# Patient Record
Sex: Male | Born: 1960 | Race: White | Hispanic: No | Marital: Married | Smoking: Current some day smoker
Health system: Western US, Community
[De-identification: ages and names within clinical notes are randomized; demographics above are authoritative.]

---

## 2014-09-20 NOTE — Telephone Encounter (Signed)
Per PB ok to schedule Pt to see him. Please transfer to MA .     LMOVM asking for a call back.

## 2014-09-20 NOTE — Telephone Encounter (Signed)
Wesley Booker is returning your call. Attempted to reach MA directly, but received no answer.    Patient call back number is: [phone]    Can a detailed message be left? yes    Additional Comments:

## 2014-09-23 NOTE — Telephone Encounter (Signed)
Spoke with pt he is scheduled for 9/27 with PB

## 2014-10-08 ENCOUNTER — Ambulatory Visit: Admit: 2014-10-08 | Discharge: 2014-10-09 | Payer: BC Managed Care – PPO | Attending: MD

## 2014-10-08 DIAGNOSIS — J301 Allergic rhinitis due to pollen: Secondary | ICD-10-CM

## 2014-10-08 NOTE — Addendum Note (Signed)
Addended by: Johann Capers on: 10/08/2014 11:10 AM     Modules accepted: Medications

## 2014-10-08 NOTE — Progress Notes (Signed)
Wesley Booker is a 54 y.o. male being seen today for Establish Care       HISTORY OF PRESENT ILLNESS  HPI  Patient is here to establish care.   Does have severe allergic rhinitis symptoms  Does have arthritis affecting knees and ankles.  Does have chronic back pain and takes aleve for pain.    Strong history of skin cancer in  Family. Does see dermatologist dr Dewayne Hatch.  There is also rather strong history of heart disease in his family. Patient himself has not had any anginal symptoms. He does admit to smoking one cigar per week.   We also discussed his weight. He does meet the criteria for obesity based on his BMI.   Past Medical History   Diagnosis Date   . Allergic rhinitis    . Arthritis      bilateral knee arthritis and ankle arthritis   . Dyslipidemia      on crestor     Past Surgical History   Procedure Laterality Date   . Menisectomy Bilateral    . Vasectomy       Family History   Problem Relation Age of Onset   . Osteoporosis Mother    . Hyperlipidemia Mother    . Hypertension Mother    . Heart disease Father    . Skin cancer Father    . Heart disease Brother    . Skin cancer Maternal Aunt    . Heart disease Maternal Grandfather    . Heart disease Paternal Grandfather      Social History     Social History Main Topics   . Smoking status: Current Some Day Smoker   . Smokeless tobacco: Never Used      Comment: 1 Cigar a week   . Alcohol Use: No   . Drug Use: No   . Sexual Activity:     Partners: Female     Social History   . Marital Status: Married     Spouse Name: N/A   . Number of Children: N/A   . Years of Education: N/A     Other Topics Concern   . None       Outpatient Prescriptions Marked as Taking for the 10/08/14 encounter (Office Visit) with Cyndia Diver, MD   Medication Sig Dispense Refill   . ascorbic acid, vitamin C, (VITAMIN C) 250 MG tablet Take 250 mg by mouth daily.     . cyanocobalamin (VITAMIN B-12) 500 MCG tablet Take 500 mcg by mouth daily.     Marland Kitchen docusate sodium (COLACE) 100 MG capsule  Take 100 mg by mouth 2 (two) times daily.     . rosuvastatin (CRESTOR) 10 MG tablet Take 10 mg by mouth daily.       Allergies no known allergies    No results found for any previous visit.    Health Maintenance  Health Maintenance Due   Topic Date Due   . COLORECTAL CANCER SCREENING  07/03/2009   . Lung Cancer Screening Program  07/04/2014   . INFLUENZA VACCINE  09/12/2014       REVIEW OF SYSTEMS  Review of Systems   Constitutional: Negative for fever, chills and fatigue.   HENT: Positive for congestion (as recurrent congestion symptoms recently did get a cortisone injection from his prior PCP currently having much less congestion feeling better overall.). Negative for drooling, postnasal drip, rhinorrhea and sinus pressure.    Eyes: Negative for visual disturbance.   Respiratory: Negative  for cough.    Cardiovascular: Negative for chest pain and palpitations.   Gastrointestinal: Negative for abdominal pain, blood in stool and anal bleeding.   Endocrine: Negative for polydipsia and polyuria.   Neurological: Negative for dizziness, light-headedness and headaches.     12 point review of systems except as noted above    PHYSICAL EXAM  BP 108/70 mmHg  Pulse 60  Temp(Src) 36.5 ?C (97.7 ?F) (Oral)  Resp 16  Ht 5' 10 (1.778 m)  Wt 237 lb (107.502 kg)  BMI 34.01 kg/m2  SpO2 98%  Normalized BMI data available only for age 32 to 20 years.  No exam data present    Physical Exam   Constitutional: He is oriented to person, place, and time. He appears well-developed and well-nourished.   HENT:   Head: Normocephalic and atraumatic.   Right Ear: External ear normal.   Left Ear: External ear normal.   Nose: Nose normal.   Mouth/Throat: Oropharynx is clear and moist.   Eyes: Conjunctivae are normal. Pupils are equal, round, and reactive to light.   Neck: Normal range of motion.   Cardiovascular: Normal rate, regular rhythm and normal heart sounds.  Exam reveals no friction rub.    No murmur heard.  Pulmonary/Chest: Effort  normal and breath sounds normal. He has no wheezes. He has no rales.   Abdominal: Soft. He exhibits no distension and no mass. There is no tenderness. There is no rebound and no guarding.   Neurological: He is alert and oriented to person, place, and time.   Skin: Skin is warm and dry.   Psychiatric: He has a normal mood and affect. His behavior is normal. Judgment and thought content normal.   Vitals reviewed.      ASSESSMENT & PLAN  Problem List Items Addressed This Visit        Respiratory    Allergic rhinitis due to pollen - Primary  Currently he is feeling better as he recently received a cortisone injection from his prior PCP. I did recommend antihistamines and possible intranasal cortisone spray in the future.        Other    BMI 34.0-34.9,adult    Current smoker on some days      Other Visit Diagnoses     Non morbid obesity due to excess calories      Recommended weight loss via a diet and exercise program. I will see him back for a full physical.          No Follow-up on file.

## 2014-10-09 MED ORDER — ascorbic acid, vitamin C, (VITAMIN C) 1000 MG tablet
1000 | Freq: Every day | ORAL | 0.00 refills | 90.00000 days | Status: AC
Start: 2014-10-09 — End: ?

## 2014-10-09 MED ORDER — naproxen sodium (ALEVE) 220 MG tablet
220 | Freq: Every day | ORAL | Status: AC
Start: 2014-10-09 — End: ?

## 2014-10-09 MED ORDER — ascorbic acid, vitamin C, (VITAMIN C) 250 MG tablet
250 | ORAL_TABLET | Freq: Every day | ORAL | 0.00 refills | 90.00000 days | Status: DC
Start: 2014-10-09 — End: 2014-10-09

## 2014-10-09 NOTE — Telephone Encounter (Signed)
From: Jeanene Erb  To: Cyndia Diver, MD  Sent: 10/09/2014 6:06 AM PDT  Subject: Non-Urgent Medical Question    Update, I received my Flu shot at my work 10/08/14.  Thank you.

## 2014-10-09 NOTE — Telephone Encounter (Signed)
From: Jeanene Erb  To: Cyndia Diver, MD  Sent: 10/09/2014 6:04 AM PDT  Subject: Medication Question    How do I get my correct medications listed on this page? I do not take Vitamin B-12, my Vitamin C is 1000 mg (I wasn't sure as the appt) and need to add Naproxen Sodium 220mg  1 daily.    Thank you.

## 2014-10-09 NOTE — Telephone Encounter (Signed)
Health Maint. Updated.   Message to pt. To inform.

## 2014-10-21 NOTE — Telephone Encounter (Signed)
LMOVM asking for a call back to reschedule appt 12/27. PB is out of office

## 2014-10-24 NOTE — Telephone Encounter (Signed)
Pt has been rescheduled 01/14/15

## 2014-12-19 NOTE — Telephone Encounter (Signed)
Patient is calling and states he would like to know what his cholesterol level is. Please call back at [phone]

## 2014-12-19 NOTE — Telephone Encounter (Signed)
Pt informed we have not received his records from Dr Elwyn Reach office. Pt will be calling today.

## 2015-01-07 ENCOUNTER — Encounter: Payer: BC Managed Care – PPO | Attending: MD

## 2015-01-14 ENCOUNTER — Encounter: Payer: BC Managed Care – PPO | Attending: MD

## 2015-01-22 ENCOUNTER — Ambulatory Visit: Admit: 2015-01-22 | Discharge: 2015-01-22 | Payer: BC Managed Care – PPO | Attending: MD

## 2015-01-22 DIAGNOSIS — Z0000 Encounter for general adult medical examination without abnormal findings: Secondary | ICD-10-CM

## 2015-01-22 MED ORDER — diclofenac sodium (VOLTAREN) 1 % Gel topical gel
1 | Freq: Four times a day (QID) | TOPICAL | 1 refills | 23.00000 days | Status: DC
Start: 2015-01-22 — End: 2015-11-28

## 2015-01-22 NOTE — Progress Notes (Signed)
Wesley Booker is a 55 y.o. male being seen today for Annual Exam       HISTORY OF PRESENT ILLNESS  HPI   Pleasant gentleman comes in today for a full physical examination. Overall he has been in very good health. He does have dyslipidemia and he does take Crestor for this without myalgias. Denies any chest pain denies any shortness of breath symptoms. Also complains of left total pain for the last few weeks. 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 No other trauma. He points to his left lateral elbow.  Otherwise she's been good health. Denies any chest pain and shortness of breath abdominal pain no melena or hematochezia. His last colonoscopy was at age 8 and was normal.      Past Medical History   Diagnosis Date   . Allergic rhinitis    . Arthritis      bilateral knee arthritis and ankle arthritis   . Dyslipidemia      on crestor     Past Surgical History   Procedure Laterality Date   . Menisectomy Bilateral    . Vasectomy     . Meniscectomy Bilateral      Family History   Problem Relation Age of Onset   . Osteoporosis Mother    . Hyperlipidemia Mother    . Hypertension Mother    . Heart disease Father    . Skin cancer Father    . Heart disease Brother    . Skin cancer Maternal Aunt    . Heart disease Maternal Grandfather    . Heart disease Paternal Grandfather      Social History     Social History Main Topics   . Smoking status: Current Some Day Smoker   . Smokeless tobacco: Never Used      Comment: 1 Cigar a week   . Alcohol use No   . Drug use: No   . Sexual activity: Yes     Partners: Female     Social History   . Marital status: Married     Spouse name: N/A   . Number of children: N/A   . Years of education: N/A     Other Topics Concern   . None       Outpatient Prescriptions Marked as Taking for the 01/22/15 encounter (Office Visit) with Cyndia Diver, MD   Medication Sig Dispense Refill   . ascorbic acid, vitamin C, (VITAMIN C) 1000 MG tablet Take 1 tablet (1,000 mg total) by mouth daily.     Marland Kitchen  docusate sodium (COLACE) 100 MG capsule Take 100 mg by mouth 2 (two) times daily.     . naproxen sodium (ALEVE) 220 MG tablet Take 1 tablet (220 mg total) by mouth daily.     . rosuvastatin (CRESTOR) 10 MG tablet Take 10 mg by mouth daily.       No Known Allergies    No results found for any previous visit.    Health Maintenance  Health Maintenance Due   Topic Date Due   . COLORECTAL CANCER SCREENING  07/03/2009       REVIEW OF SYSTEMS  Review of Systems   Constitutional: Negative.  Negative for activity change, appetite change, chills, diaphoresis, fatigue, fever and unexpected weight change.   HENT: Negative.  Negative for congestion, dental problem, ear pain, hearing loss, mouth sores, nosebleeds, postnasal drip, sinus pressure, sneezing, sore throat, tinnitus and voice change.    Eyes: Negative.  Negative  for photophobia, pain, discharge and visual disturbance.   Respiratory: Negative.  Negative for cough, chest tightness, shortness of breath and wheezing.    Cardiovascular: Negative.  Negative for chest pain, palpitations and leg swelling.   Gastrointestinal: Negative.  Negative for abdominal distention, abdominal pain, anal bleeding, blood in stool, constipation, diarrhea, nausea, rectal pain and vomiting.   Endocrine: Negative.  Negative for cold intolerance, heat intolerance, polydipsia and polyuria.   Genitourinary: Negative.  Negative for decreased urine volume, dysuria, flank pain, frequency, genital sores, hematuria, penile pain, penile swelling, scrotal swelling, testicular pain and urgency.   Musculoskeletal: Negative for arthralgias, back pain, joint swelling, myalgias and neck stiffness.        Left elbow pain   Skin: Negative.  Negative for rash.   Allergic/Immunologic: Negative.  Negative for environmental allergies.   Neurological: Negative.  Negative for dizziness, tremors, seizures, syncope, weakness, numbness and headaches.   Hematological: Negative.  Negative for adenopathy. Does not  bruise/bleed easily.   Psychiatric/Behavioral: Negative.  Negative for behavioral problems, dysphoric mood, sleep disturbance and suicidal ideas. The patient is not nervous/anxious.      12 point review of systems except as noted above    PHYSICAL EXAM  Visit Vitals   . BP 110/70 (BP Location: Left arm, Patient Position: Sitting)   . Pulse 64   . Temp 36.7 ?C (98 ?F) (Oral)   . Resp 16   . Ht 5' 10 (1.778 m)   . Wt 236 lb (107 kg)   . SpO2 98%   . BMI 33.86 kg/m2     Facility age limit for growth percentiles is 20 years.  No exam data present    Physical Exam   Constitutional: He is oriented to person, place, and time. He appears well-developed and well-nourished. No distress.   Obese gentleman in no acute distress   HENT:   Head: Normocephalic and atraumatic.   Right Ear: External ear normal.   Left Ear: External ear normal.   Nose: Nose normal.   Mouth/Throat: Oropharynx is clear and moist. No oropharyngeal exudate.   Eyes: Conjunctivae, EOM and lids are normal. Pupils are equal, round, and reactive to light. Right eye exhibits no exudate. Left eye exhibits no exudate.   Neck: Trachea normal, normal range of motion and full passive range of motion without pain. Neck supple. No JVD present. No muscular tenderness present. Carotid bruit is not present. No edema, no erythema and normal range of motion present. No thyroid mass and no thyromegaly present.   Cardiovascular: Normal rate, regular rhythm, S1 normal, S2 normal, normal heart sounds and normal pulses.  Exam reveals no gallop and no friction rub.    No murmur heard.  Pulmonary/Chest: Effort normal and breath sounds normal. No accessory muscle usage. No respiratory distress. He has no wheezes. He has no rales.   Abdominal: Soft. Bowel sounds are normal. He exhibits no distension and no mass. There is no hepatosplenomegaly. There is no tenderness. There is no rebound, no guarding and no CVA tenderness. Hernia confirmed negative in the right inguinal area and  confirmed negative in the left inguinal area.   Genitourinary: Rectum normal, prostate normal, testes normal and penis normal. Rectal exam shows no external hemorrhoid, no mass, no tenderness and guaiac negative stool. Prostate is not enlarged and not tender. Right testis shows no mass, no swelling and no tenderness. Left testis shows no mass, no swelling and no tenderness. Circumcised. No penile tenderness. No discharge found.  Musculoskeletal: Normal range of motion. He exhibits tenderness (patient does have tenderness of his left lateral epicondyle upon palpation.). He exhibits no edema.        Right shoulder: He exhibits normal range of motion, no tenderness, no bony tenderness, no swelling, no deformity, no pain and normal strength.   Lymphadenopathy:     He has no cervical adenopathy.        Right: No inguinal and no supraclavicular adenopathy present.        Left: No inguinal and no supraclavicular adenopathy present.   Neurological: He is alert and oriented to person, place, and time. He has normal strength and normal reflexes. No cranial nerve deficit or sensory deficit. He displays a negative Romberg sign.   Skin: Skin is warm and dry. No ecchymosis and no rash noted. No erythema.   Psychiatric: He has a normal mood and affect. His speech is normal and behavior is normal. Judgment and thought content normal. Cognition and memory are normal.   Vitals reviewed.      ASSESSMENT & PLAN  Problem List Items Addressed This Visit        Other    Current smoker on some days      Other Visit Diagnoses     Physical exam    -  Primary    Relevant Orders    CBC with Auto Differential -Routine    Comprehensive Metabolic Panel -Routine    Coronary Risk Lipid Panel -Routine    PSA Screening -Routine  Physical examination was conducted from today laboratory studies requested     BMI 33.0-33.9,adult        Lateral epicondylitis of left elbow        Relevant Medications    diclofenac sodium (VOLTAREN) 1 % Gel topical gel     Other Relevant Orders    Home Medical Equipment  Recommended topical diclofenac gel also recommended tennis elbow strap     Dyslipidemia      Recommend Crestor check lipid panel and hepatic function panel     Obesity: Recommended weight loss via diet and exercise. I referred him to weight watchers. See me for recheck in 4 months.         No Follow-up on file.

## 2015-01-24 ENCOUNTER — Other Ambulatory Visit: Admit: 2015-01-24 | Discharge: 2015-01-24 | Payer: BC Managed Care – PPO

## 2015-01-24 DIAGNOSIS — Z0000 Encounter for general adult medical examination without abnormal findings: Secondary | ICD-10-CM

## 2015-01-24 LAB — COMPREHENSIVE METABOLIC PANEL
ALT - Alanine Aminotransferase: 11 IU/L (ref 7–52)
AST - Aspartate Aminotransferase: 18 IU/L (ref 10–50)
Albumin/Globulin Ratio: 1.4 (ref >0.9)
Albumin: 3.9 g/dL (ref 3.5–5.0)
Alkaline Phosphatase: 68 IU/L (ref 34–104)
Anion Gap: 8 mmol/L (ref 3.0–11.0)
BUN: 25 mg/dL — ABNORMAL HIGH (ref 6–23)
Bilirubin Total: 0.9 mg/dL (ref 0.3–1.2)
CO2 - Carbon Dioxide: 24 mmol/L (ref 21.0–31.0)
Calcium: 8.9 mg/dL (ref 8.6–10.3)
Chloride: 108 mmol/L (ref 98–111)
Creatinine: 0.98 mg/dL (ref 0.65–1.30)
GFR Estimate: >60 mL/min/{1.73_m2} (ref >=60)
Globulin: 2.7 g/dL (ref 2.2–3.7)
Glucose: 81 mg/dL (ref 80–99)
Potassium: 4.1 mmol/L (ref 3.5–5.1)
Protein Total: 6.6 g/dL (ref 6.0–8.0)
Sodium: 140 mmol/L (ref 135–143)

## 2015-01-24 LAB — CBC WITH AUTO DIFFERENTIAL
Basophils %: 1 % (ref 0–2)
Basophils, Absolute: 0.1 10*3/ÂµL (ref 0.0–0.2)
Eosinophils %: 3 % (ref 0–7)
Eosinophils, Absolute: 0.2 10*3/ÂµL (ref 0.0–0.7)
HCT: 42.1 % (ref 42.0–54.0)
Hemoglobin: 14.3 g/dL (ref 12.0–18.0)
Lymphocytes %: 37 % (ref 25–45)
Lymphocytes, Absolute: 2.2 10*3/ÂµL (ref 1.1–4.3)
MCH: 31.4 pg (ref 27.0–34.0)
MCHC: 34 g/dL (ref 32.0–36.0)
MCV: 92.3 fL (ref 81.0–99.0)
MPV: 9.6 fL (ref 7.4–10.4)
Monocytes %: 8 % (ref 0–12)
Monocytes, Absolute: 0.5 10*3/ÂµL (ref 0.0–1.2)
Neutrophils %: 52 % (ref 35–70)
Neutrophils, Absolute: 3.1 10*3/ÂµL (ref 1.6–7.3)
Platelet Count: 183 10*3/ÂµL (ref 150–400)
RBC: 4.56 10*6/ÂµL — ABNORMAL LOW (ref 4.70–6.10)
RDW: 13.2 % (ref 11.5–14.5)
WBC: 5.9 10*3/ÂµL (ref 4.8–10.8)

## 2015-01-24 LAB — CORONARY RISK LIPID PANEL
Cholesterol, HDL: 44 mg/dL (ref >=40)
Cholesterol: 173 mg/dL (ref <200)
LDL Calculated: 113 mg/dL — ABNORMAL HIGH (ref <100)
Triglyceride: 78 mg/dL (ref 30–149)

## 2015-01-24 LAB — PSA, TOTAL SCREENING: Prostate Specific Antigen, Total: 0.66 ng/mL (ref 0.00–4.00)

## 2015-01-31 NOTE — Telephone Encounter (Signed)
Spoke with patient and advised him of lab results 

## 2015-01-31 NOTE — Telephone Encounter (Signed)
Patient called in regards to wanting to know if his results are in , Patient states he had his labs done last Friday , Please call [phone]

## 2015-03-25 NOTE — Telephone Encounter (Signed)
Patient is calling and would like to set up an appointment for an allergy shot as well as a Hep A shot.  Please call patient to schedule at [phone]. Okay to leave message.

## 2015-03-25 NOTE — Telephone Encounter (Signed)
Moved to another, existing phone note

## 2015-03-25 NOTE — Telephone Encounter (Signed)
Medication Being Requested:  Requested medication is not on patient's med list and they are requesting albuterol for nebulizer.    Send To Pharmacy, or Office Pick Up? Send to [id number] - [place], [place] - [address] [facility]  [address] [place] [address]  Phone: [phone] Fax: [phone]    How many Days Remaining of medication: 0    Additional Information:     Patient has not had this refilled by Dr Royal Hawthorn yet.  Any questions please call patient back at [phone], okay to leave message.      **Informed patient that it may take up to 48-72 hours to fill prescription.**    Future Appointments:  Future Appointments  Date Time Provider Department Center   05/30/2015 8:20 AM Cyndia Diver, MD [facility]

## 2015-03-25 NOTE — Telephone Encounter (Addendum)
Call to pt. - BUSY x 1, Albuterol has not been rx'd by PB.   For what is he using it for?     Copy and pasted from another phone note, that came in this AM:  Patient is calling and would like to set up an appointment for an allergy shot as well as a Hep A shot. Please call patient to schedule at [phone]. Okay to leave message.

## 2015-03-27 NOTE — Telephone Encounter (Signed)
Wesley Booker is returning your call. Attempted to reach clinic directly, but received no answer.    Patient call back number is: [phone] (home) [phone] (work)      Best time for call back.any    Can a detailed message be left? yes    Additional Comments:

## 2015-03-28 MED ORDER — albuterol (PROVENTIL) 2.5 mg /3 mL (0.083 %) nebulizer solution
2.5 | RESPIRATORY_TRACT | 3 refills | 17.00000 days | Status: AC | PRN
Start: 2015-03-28 — End: ?

## 2015-03-28 NOTE — Telephone Encounter (Signed)
Pt. Stating he prefers to receive the kenalog and also wants the HepA vaccine, (will needing to make another appt. In 6 month for the 2nd vaccine, to be given in 6 months.   Requesting the Albuterol to be via Nebulizer vials.   Wants RX sent to [facility] in [place].

## 2015-03-28 NOTE — Telephone Encounter (Signed)
Next appt. 05/30/15  Pt. Going on a Cruise in April and was told by Dr.Bujos should have an Hep A vaccination   - Would like to know if able to have an Hep A injeciton?   - Also would like to know if able to get a Allergy shot  (kenalog)   - Also asking to have an RX for Albuterol vials for Nebulizer, using this for his Allergies, was told by PB to just call and ask for an RX, that PB would approved.    (pt. Was told this by PB at [facility] meeting)     PB - please advise, OK to give RX for Albuterol?  OK for Hep A vaccine? OK for Allergy shot? Can the Allergy shot be given at the same time as the Hep A? thanks

## 2015-03-28 NOTE — Telephone Encounter (Signed)
First, he can differently have hepatitis A shot and is okay with me for albuterol inhaler 1-2 puffs every 4 hours when necessary wheezing. I am not a big fan of him getting cortisone injection as although this does help allergies can also increase his risk of infection. If he absolutely wants a cortisone injection , I would be okay this one time but I do not want this on a yearly  basis. (Kenalog 40 mg IM ?1)

## 2015-03-31 ENCOUNTER — Institutional Professional Consult (permissible substitution): Admit: 2015-03-31 | Discharge: 2015-03-31 | Payer: BC Managed Care – PPO | Attending: MD

## 2015-03-31 DIAGNOSIS — Z23 Encounter for immunization: Secondary | ICD-10-CM

## 2015-03-31 MED ORDER — triamcinolone acetonide (KENALOG-40) injection 40 mg
40 | Freq: Once | INTRAMUSCULAR | Status: AC
Start: 2015-03-31 — End: 2015-03-31
  Administered 2015-03-31: 23:00:00 40 mg via INTRAMUSCULAR

## 2015-03-31 NOTE — Progress Notes (Signed)
..    Administrations This Visit     triamcinolone acetonide (KENALOG-40) injection 40 mg     Admin Date Action Dose Route Site Administered By          03/31/2015  15:54 Given 40 mg Intramuscular Right Deltoid Wyatt Haste, MA       Ordering Provider:  Cyndia Diver, MD     NDC:  [phone]     Lot#:  [id number]     Manufacturer:  [employer]     Patient Supplied?:  No     Comments:  exp- 06/2016                     ..  Immunizations as of 03/31/2015     Name Date Dose VIS Date Route    Hepatitis A 03/31/2015  3:54 PM 0.5 mL 07/31/2014 Intramuscular    Site: Left deltoid    Given By: Wyatt Haste, MA    Documented By: Wyatt Haste, MA    Manufacturer: [employer]    Lot: [id number]    NDC: [phone]    Expiration Date: 11/27/2016    Influenza TIV (IM) 10/08/2014 -- -- --    Documented By: Johann Capers, CMA    Comment: received at work, [facility], pt. reported

## 2015-03-31 NOTE — Telephone Encounter (Signed)
Pt would like to see allergist in [place]. i called pt to inform that [facility] does not have an allergist and if he would like to be seen in [place].

## 2015-04-02 NOTE — Telephone Encounter (Signed)
Spoke to pt and informed there is an allergist in [place] [facility].. Pt informs he would like to be seen by him. Referral entered.

## 2015-05-30 ENCOUNTER — Ambulatory Visit: Admit: 2015-05-30 | Discharge: 2015-05-30 | Payer: BC Managed Care – PPO | Attending: MD

## 2015-05-30 DIAGNOSIS — F4321 Adjustment disorder with depressed mood: Secondary | ICD-10-CM

## 2015-05-30 NOTE — Progress Notes (Signed)
Wesley Booker is a 55 y.o. male being seen today for Hyperlipidemia and Weight Check (in Jan. 2017 was 236lb)       HISTORY OF PRESENT ILLNESS  HPI    This very pleasant gentleman comes in today to discuss multiple issues.  We first discussed his father's death last week. Patient is grieving at this time. We discussed the circumstances of his father's illness. We discussed treatment of the patient's grieving. Patient denies feeling suicidal or homicidal.    Discussed his dyslipidemia. Mildly elevated LDL. He continued to take Crestor a daily basis without any myalgias. We discussed weight loss via diet and exercise. Discussed low saturated fat diet today. In addition we discussed his obesity. He has been trying to lose weight via diet and exercise. He happily has lost about 17 pounds since last visit.            Past Medical History:   Diagnosis Date   . Allergic rhinitis    . Arthritis     bilateral knee arthritis and ankle arthritis   . Dyslipidemia     on crestor     Past Surgical History:   Procedure Laterality Date   . MENISCECTOMY Bilateral    . menisectomy Bilateral    . VASECTOMY       Family History   Problem Relation Age of Onset   . Osteoporosis Mother    . Hyperlipidemia Mother    . Hypertension Mother    . Heart disease Father    . Skin cancer Father    . Heart disease Brother    . Skin cancer Maternal Aunt    . Heart disease Maternal Grandfather    . Heart disease Paternal Grandfather      Social History     Social History Main Topics   . Smoking status: Current Some Day Smoker   . Smokeless tobacco: Never Used      Comment: 1 Cigar a week   . Alcohol use No   . Drug use: No   . Sexual activity: Yes     Partners: Female     Social History   . Marital status: Married     Spouse name: N/A   . Number of children: N/A   . Years of education: N/A     Other Topics Concern   . None       Outpatient Prescriptions Marked as Taking for the 05/30/15 encounter (Office Visit) with Cyndia Diver, MD   Medication Sig  Dispense Refill   . albuterol (PROVENTIL) 2.5 mg /3 mL (0.083 %) nebulizer solution Take 3 mLs (2.5 mg total) by nebulization every 4 (four) hours as needed for Wheezing or Shortness of Breath (as needed). 120 vial 3   . ascorbic acid, vitamin C, (VITAMIN C) 1000 MG tablet Take 1 tablet (1,000 mg total) by mouth daily.     . diclofenac sodium (VOLTAREN) 1 % Gel topical gel Apply 2 g topically 4 (four) times daily. 100 g 1   . docusate sodium (COLACE) 100 MG capsule Take 100 mg by mouth 2 (two) times daily.     . naproxen sodium (ALEVE) 220 MG tablet Take 1 tablet (220 mg total) by mouth daily.     . pseudoephedrine (SUDAFED) 30 MG tablet Take 30 mg by mouth every 4 (four) hours as needed for Congestion.     . rosuvastatin (CRESTOR) 10 MG tablet Take 10 mg by mouth daily.  No Known Allergies    No visits with results within 1 Month(s) from this visit.  Latest known visit with results is:    Lab Walk-In on 01/24/2015   Component Date Value Ref Range Status   . WBC 01/24/2015 5.9  4.8 - 10.8 10*3/?L Final   . RBC 01/24/2015 4.56* 4.70 - 6.10 10*6/?L Final   . Hemoglobin 01/24/2015 14.3  12.0 - 18.0 g/dL Final   . HCT 11/91/4782 42.1  42.0 - 54.0 % Final   . MCV 01/24/2015 92.3  81.0 - 99.0 fL Final   . MCH 01/24/2015 31.4  27.0 - 34.0 pg Final   . MCHC 01/24/2015 34.0  32.0 - 36.0 g/dL Final   . RDW 95/62/1308 13.2  11.5 - 14.5 % Final   . Platelet Count 01/24/2015 183  150 - 400 10*3/?L Final   . MPV 01/24/2015 9.6  7.4 - 10.4 fL Final   . Neutrophils % 01/24/2015 52  35 - 70 % Final   . Lymphocytes % 01/24/2015 37  25 - 45 % Final   . Monocytes % 01/24/2015 8  0 - 12 % Final   . Eosinophils % 01/24/2015 3  0 - 7 % Final   . Basophils % 01/24/2015 1  0 - 2 % Final   . Neutrophils, Absolute 01/24/2015 3.1  1.6 - 7.3 10*3/?L Final   . Lymphocytes, Absolute 01/24/2015 2.2  1.1 - 4.3 10*3/?L Final   . Monocytes, Absolute 01/24/2015 0.5  0.0 - 1.2 10*3/?L Final   . Eosinophils, Absolute 01/24/2015 0.2  0.0 - 0.7  10*3/?L Final   . Basophils, Absolute 01/24/2015 0.1  0.0 - 0.2 10*3/?L Final   . Differential Type 01/24/2015 Automated Differential   Final   . Sodium 01/24/2015 140  135 - 143 mmol/L Final   . Potassium 01/24/2015 4.1  3.5 - 5.1 mmol/L Final   . Chloride 01/24/2015 108  98 - 111 mmol/L Final   . CO2 - Carbon Dioxide 01/24/2015 24.0  21.0 - 31.0 mmol/L Final   . Glucose 01/24/2015 81  80 - 99 mg/dL Final   . BUN 65/78/4696 25* 6 - 23 mg/dL Final   . Creatinine 29/52/8413 0.98  0.65 - 1.30 mg/dL Final   . Calcium 24/40/1027 8.9  8.6 - 10.3 mg/dL Final   . AST - Aspartate Aminotransferase 01/24/2015 18  10 - 50 IU/L Final   . ALT - Alanine Amino transferase 01/24/2015 11  7 - 52 IU/L Final   . Alkaline Phosphatase 01/24/2015 68  34 - 104 IU/L Final   . Bilirubin Total 01/24/2015 0.9  0.3 - 1.2 mg/dL Final   . Protein Total 01/24/2015 6.6  6.0 - 8.0 g/dL Final   . Albumin 25/36/6440 3.9  3.5 - 5.0 g/dL Final   . Globulin 34/74/2595 2.7  2.2 - 3.7 g/dL Final   . Albumin/Globulin Ratio 01/24/2015 1.4  >0.9 Final   . Anion Gap 01/24/2015 8.0  3.0 - 11.0 mmol/L Final   . GFR Estimate 01/24/2015 >60  >=60 mL/min/1.24m*2 Final   . GFR Additional Info 01/24/2015    Final   . Triglyceride 01/24/2015 78  30 - 149 mg/dL Final   . Cholesterol 63/87/5643 173  <200 mg/dL Final   . Cholesterol, HDL 01/24/2015 44  >=40 mg/dL Final   . LDL Calculated 01/24/2015 329* <100 mg/dL Final   . PSA 51/88/4166 0.66  0.00 - 4.00 ng/mL Final       Health Maintenance  Health Maintenance Due   Topic Date Due   . COLORECTAL CANCER SCREENING  07/03/2009       REVIEW OF SYSTEMS  Review of Systems   Constitutional: Negative for fatigue.   Respiratory: Negative for cough and shortness of breath.    Cardiovascular: Negative for chest pain and palpitations.   Gastrointestinal: Negative for abdominal pain.   Endocrine: Negative for polydipsia and polyuria.   Musculoskeletal: Negative for myalgias.   Psychiatric/Behavioral: Negative for agitation,  dysphoric mood, self-injury, sleep disturbance and suicidal ideas. The patient is not nervous/anxious.         She is grieving the loss of his father.     12 point review of systems except as noted above    PHYSICAL EXAM  BP 112/70 (BP Location: Left arm, Patient Position: Sitting)  Pulse 64  Temp 36.7 ?C (98 ?F) (Oral)   Resp 16  Wt 219 lb (99.3 kg)  SpO2 100%  BMI 31.42 kg/m2  Facility age limit for growth percentiles is 20 years.  No exam data present    Physical Exam   Constitutional: He is oriented to person, place, and time. He appears well-developed and well-nourished.   HENT:   Head: Normocephalic and atraumatic.   Eyes: Pupils are equal, round, and reactive to light.   Cardiovascular: Normal rate, regular rhythm and normal heart sounds.  Exam reveals no gallop.    No murmur heard.  Pulmonary/Chest: Effort normal and breath sounds normal. He has no wheezes. He has no rales.   Neurological: He is alert and oriented to person, place, and time.   Psychiatric:   Patient is grieving the loss of his father but denies feeling suicidal or homicidal. He is well-groomed and well-dressed and establishes good eye contact.       ASSESSMENT & PLAN  Problem List Items Addressed This Visit        Other    Current smoker on some days    BMI 31.0-31.9,adult    Grieving - Primary  I did offer him counseling. At this time declines. He does have good family support at home. Offered him medication at this time he declines.     Dyslipidemia  Recommended low saturated fat diet recommended regular exercise continue Crestor     Obesity    Relevant Medications     continue weight loss efforts via diet and exercise see me for recheck in the next 6 months.         No Follow-up on file.    Marland Kitchen

## 2015-05-30 NOTE — Telephone Encounter (Signed)
Approved.  

## 2015-05-30 NOTE — Telephone Encounter (Signed)
Pt. Has CPE appt. On 01/27/16, last annual labs done 01/24/15.  Pt. Requesting to have annual labs done prior to his CPE appt.     PB - OK to order the same labs as in Jan. 2017? thanks

## 2015-05-30 NOTE — Telephone Encounter (Signed)
Noted, Labs ordered for 01/26/16, for prior to his CPE.   Message sent to pt via my chart to inform of fasting labs ordered.

## 2015-09-02 NOTE — Telephone Encounter (Signed)
Noted, next Lipid due 01/26/16, per lab orders  RX filled

## 2015-09-02 NOTE — Telephone Encounter (Signed)
Medication Being Requested:  rosuvastatin (CRESTOR) 10 MG tablet    Send To Pharmacy, or Office Pick Up? [facility]     [facility] - [place], [place] - [address]  [address]  [address]  [place] [address]  Phone: [phone] Fax: [phone]        How many Days Remaining of medication: 2weeks   Additional Information:       **Informed patient that it may take up to 48-72 hours to fill prescription.**    Future Appointments:  Future Appointments  Date Time Provider Department Center   10/01/2015 11:40 AM Cyndia Diver, MD [facility]   01/27/2016 9:20 AM Cyndia Diver, MD [id number]

## 2015-09-03 MED ORDER — rosuvastatin (CRESTOR) 10 MG tablet
10 | ORAL_TABLET | Freq: Every day | ORAL | 1 refills | 90.00000 days | Status: DC
Start: 2015-09-03 — End: 2016-03-08

## 2015-10-01 ENCOUNTER — Encounter: Payer: BC Managed Care – PPO | Attending: MD

## 2015-10-01 ENCOUNTER — Institutional Professional Consult (permissible substitution): Admit: 2015-10-01 | Discharge: 2015-10-01 | Payer: BC Managed Care – PPO | Attending: MD

## 2015-10-01 DIAGNOSIS — Z23 Encounter for immunization: Secondary | ICD-10-CM

## 2015-10-01 NOTE — Progress Notes (Signed)
Immunizations     Name Date Dose VIS Date Route    Hepatitis A 10/01/2015 0.5 mL 07/31/2014 Intramuscular    Site: Left deltoid    Given By: Johann Capers, CMA    Documented By: Johann Capers, CMA    Manufacturer: [employer]    Lot: [id number]    NDC: [id number]    Expiration Date: 02/12/2018

## 2015-11-28 ENCOUNTER — Ambulatory Visit: Admit: 2015-11-28 | Discharge: 2015-11-28 | Payer: BC Managed Care – PPO | Attending: MD

## 2015-11-28 DIAGNOSIS — J45998 Other asthma: Secondary | ICD-10-CM

## 2015-11-28 MED ORDER — predniSONE (DELTASONE) 20 MG tablet
20 | ORAL_TABLET | Freq: Every day | ORAL | 0 refills | 11.50000 days | Status: AC
Start: 2015-11-28 — End: 2015-12-03

## 2015-11-28 MED ORDER — albuterol (PROVENTIL HFA;VENTOLIN HFA;PROAIR HFA) 90 mcg/actuation inhaler
90 | RESPIRATORY_TRACT | 0 refills | 17.00000 days | Status: AC | PRN
Start: 2015-11-28 — End: ?

## 2015-11-28 MED ORDER — ipratropium-albuterol (DUO-NEB) 0.5 mg-3 mg(2.5 mg base)/3 mL nebulizer solution 3 mL
0.5 | Freq: Once | RESPIRATORY_TRACT | Status: AC
Start: 2015-11-28 — End: 2015-11-28
  Administered 2015-11-28: 18:00:00 0.5 mL via RESPIRATORY_TRACT

## 2015-11-28 NOTE — Discharge Instructions (Signed)
Patient Education   Patient Education     Asthma, Adult  Asthma is a recurring condition in which the airways tighten and narrow. Asthma can make it difficult to breathe. It can cause coughing, wheezing, and shortness of breath. Asthma episodes, also called asthma attacks, range from minor to life-threatening. Asthma cannot be cured, but medicines and lifestyle changes can help control it.  CAUSES  Asthma is believed to be caused by inherited (genetic) and environmental factors, but its exact cause is unknown. Asthma may be triggered by allergens, lung infections, or irritants in the air. Asthma triggers are different for each person. Common triggers include:   ? Animal dander.  ? Dust mites.  ? Cockroaches.  ? Pollen from trees or grass.  ? Mold.  ? Smoke.  ? Air pollutants such as dust, household cleaners, hair sprays, aerosol sprays, paint fumes, strong chemicals, or strong odors.  ? Cold air, weather changes, and winds (which increase molds and pollens in the air).  ? Strong emotional expressions such as crying or laughing hard.  ? Stress.  ? Certain medicines (such as aspirin) or types of drugs (such as beta-blockers).  ? Sulfites in foods and drinks. Foods and drinks that may contain sulfites include dried fruit, potato chips, and sparkling grape juice.  ? Infections or inflammatory conditions such as the flu, a cold, or an inflammation of the nasal membranes (rhinitis).  ? Gastroesophageal reflux disease (GERD).  ? Exercise or strenuous activity.  SYMPTOMS  Symptoms may occur immediately after asthma is triggered or many hours later. Symptoms include:  ? Wheezing.  ? Excessive nighttime or early morning coughing.  ? Frequent or severe coughing with a common cold.  ? Chest tightness.  ? Shortness of breath.  DIAGNOSIS   The diagnosis of asthma is made by a review of your medical history and a physical exam. Tests may also be performed. These may include:  ? Lung function studies. These tests show how much air  you breathe in and out.  ? Allergy tests.  ? Imaging tests such as X-rays.  TREATMENT   Asthma cannot be cured, but it can usually be controlled. Treatment involves identifying and avoiding your asthma triggers. It also involves medicines. There are 2 classes of medicine used for asthma treatment:   ? Controller medicines. These prevent asthma symptoms from occurring. They are usually taken every day.  ? Reliever or rescue medicines. These quickly relieve asthma symptoms. They are used as needed and provide short-term relief.  Your health care provider will help you create an asthma action plan. An asthma action plan is a written plan for managing and treating your asthma attacks. It includes a list of your asthma triggers and how they may be avoided. It also includes information on when medicines should be taken and when their dosage should be changed. An action plan may also involve the use of a device called a peak flow meter. A peak flow meter measures how well the lungs are working. It helps you monitor your condition.  HOME CARE INSTRUCTIONS   ? Take medicines only as directed by your health care provider. Speak with your health care provider if you have questions about how or when to take the medicines.  ? Use a peak flow meter as directed by your health care provider. Record and keep track of readings.  ? Understand and use the action plan to help minimize or stop an asthma attack without needing to seek medical care.  ?   Control your home environment in the following ways to help prevent asthma attacks:    Do not smoke. Avoid being exposed to secondhand smoke.    Change your heating and air conditioning filter regularly.    Limit your use of fireplaces and wood stoves.    Get rid of pests (such as roaches and mice) and their droppings.    Throw away plants if you see mold on them.    Clean your floors and dust regularly. Use unscented cleaning products.    Try to have someone else vacuum for you regularly.  Stay out of rooms while they are being vacuumed and for a short while afterward. If you vacuum, use a dust mask from a hardware store, a double-layered or microfilter vacuum cleaner bag, or a vacuum cleaner with a HEPA filter.    Replace carpet with wood, tile, or vinyl flooring. Carpet can trap dander and dust.    Use allergy-proof pillows, mattress covers, and box spring covers.    Wash bed sheets and blankets every week in hot water and dry them in a dryer.    Use blankets that are made of polyester or cotton.    Clean bathrooms and kitchens with bleach. If possible, have someone repaint the walls in these rooms with mold-resistant paint. Keep out of the rooms that are being cleaned and painted.    Wash hands frequently.  SEEK MEDICAL CARE IF:   ? You have wheezing, shortness of breath, or a cough even if taking medicine to prevent attacks.  ? The colored mucus you cough up (sputum) is thicker than usual.  ? Your sputum changes from clear or white to yellow, green, gray, or bloody.  ? You have any problems that may be related to the medicines you are taking (such as a rash, itching, swelling, or trouble breathing).  ? You are using a reliever medicine more than 2-3 times per week.  ? Your peak flow is still at 50-79% of your personal best after following your action plan for 1 hour.  ? You have a fever.  SEEK IMMEDIATE MEDICAL CARE IF:   ? You seem to be getting worse and are unresponsive to treatment during an asthma attack.  ? You are short of breath even at rest.  ? You get short of breath when doing very little physical activity.  ? You have difficulty eating, drinking, or talking due to asthma symptoms.  ? You develop chest pain.  ? You develop a fast heartbeat.  ? You have a bluish color to your lips or fingernails.  ? You are light-headed, dizzy, or faint.  ? Your peak flow is less than 50% of your personal best.     This information is not intended to replace advice given to you by your health care  provider. Make sure you discuss any questions you have with your health care provider.     Document Released: 12/28/2004 Document Revised: 09/18/2014 Document Reviewed: 07/27/2012  Elsevier Interactive Patient Education ?2017 [employer]

## 2015-11-28 NOTE — Progress Notes (Signed)
Wesley Booker is a 55 y.o. male, DOB 05-27-1960, who presents today for Wheezing (nebulizer treatment is not working; coughing up phlegm; congestion; x 7 days)  .    HISTORY OF PRESENT ILLNESS:  55 year old male presents complaining of cough for the last 7 days. His cough is productive. He says he has had sore throat at times and nasal congestion. He says his sore throat has resolved and now he still has some postnasal drip. He denies fever, otalgia, nausea and vomiting. He says he does not have asthma but he suffers from severe allergies and for this he uses a nebulizer machine at home. He says at the beginning of this illness he was using his nebulizer machine 4 times a day and this was helping significantly however for the last few days he reports that he has noticed he's been wheezing despite the use of nebulizers and they only help him for a brief period of time. He says the last time he used his nebs was yesterday.     HPI  Past Medical History:   Diagnosis Date   . Allergic rhinitis    . Arthritis     bilateral knee arthritis and ankle arthritis   . Dyslipidemia     on crestor     No Known Allergies  Past Surgical History:   Procedure Laterality Date   . MENISCECTOMY Bilateral    . menisectomy Bilateral    . VASECTOMY       Social History     Social History   . Marital status: Married     Spouse name: N/A   . Number of children: N/A   . Years of education: N/A     Occupational History   . Not on file.     Social History Main Topics   . Smoking status: Current Some Day Smoker   . Smokeless tobacco: Never Used      Comment: 1 Cigar a week   . Alcohol use No   . Drug use: No   . Sexual activity: Yes     Partners: Female     Other Topics Concern   . Not on file     Social History Narrative   . No narrative on file     Current Outpatient Prescriptions on File Prior to Visit   Medication Sig Dispense Refill   . albuterol (PROVENTIL) 2.5 mg /3 mL (0.083 %) nebulizer solution Take 3 mLs (2.5 mg total) by  nebulization every 4 (four) hours as needed for Wheezing or Shortness of Breath (as needed). 120 vial 3   . ascorbic acid, vitamin C, (VITAMIN C) 1000 MG tablet Take 1 tablet (1,000 mg total) by mouth daily.     Marland Kitchen docusate sodium (COLACE) 100 MG capsule Take 100 mg by mouth 2 (two) times daily.     . naproxen sodium (ALEVE) 220 MG tablet Take 1 tablet (220 mg total) by mouth daily.     . rosuvastatin (CRESTOR) 10 MG tablet Take 1 tablet by mouth daily. 90 tablet 1   . pseudoephedrine (SUDAFED) 30 MG tablet Take 30 mg by mouth every 4 (four) hours as needed for Congestion.       No current facility-administered medications on file prior to visit.      Family History   Problem Relation Age of Onset   . Osteoporosis Mother    . Hyperlipidemia Mother    . Hypertension Mother    . Heart disease Father    .  Skin cancer Father    . Heart disease Brother    . Skin cancer Maternal Aunt    . Heart disease Maternal Grandfather    . Heart disease Paternal Grandfather          REVIEW OF SYSTEMS:  Review of Systems   As per HPI    PHYSICAL EXAM:    Vitals:    11/28/15 1006   BP: 112/64   Pulse: 67   Resp: 12   Temp: 36.8 ?C (98.3 ?F)   SpO2: 98%    Body mass index is 31.57 kg/m?Marland Kitchen  Physical Exam   Constitutional: He is oriented to person, place, and time. He appears well-developed and well-nourished. No distress.   Well appearing   HENT:   Head: Normocephalic and atraumatic.   Right Ear: External ear normal.   Left Ear: External ear normal.   Eyes: Conjunctivae and EOM are normal. Right eye exhibits no discharge. Left eye exhibits no discharge. No scleral icterus.   Cardiovascular: Normal rate, regular rhythm and normal heart sounds.    Pulmonary/Chest: Effort normal. No respiratory distress. He has wheezes (Wheezes throughout lung fields, moderate improvement with duoneb administration). He has no rales.   Neurological: He is alert and oriented to person, place, and time.   Skin: He is not diaphoretic.   Psychiatric: He has a  normal mood and affect. His behavior is normal. Judgment and thought content normal.   Vitals reviewed.      LAB RESULTS:    No results found for this or any previous visit (from the past 24 hour(s)).    IMAGING:    No results found.    ASSESSMENT & PLAN:    1. Smoker    2. Wheezing  - ipratropium-albuterol (DUO-NEB) 0.5 mg-3 mg(2.5 mg base)/3 mL nebulizer solution 3 mL; Take 3 mL by nebulization once.  - Nebulizer treatment    3. Post viral asthma  - predniSONE (DELTASONE) 20 MG tablet; Take 2 tablets by mouth daily for 5 days.  Dispense: 10 tablet; Refill: 0  - albuterol (PROVENTIL HFA;VENTOLIN HFA;PROAIR HFA) 90 mcg/actuation inhaler; Inhale 2 puffs into the lungs every 4 hours as needed for Wheezing (Cough).  Dispense: 1 Inhaler; Refill: 0    Persistent cough and wheezing in the setting of recent URI. Wheezing and symptoms moderately improved after DuoNeb administration. Pt with h/o allergic rhinitis and likely a component of underlying RAD. Suspect viral induced asthma. PNA less likely. Pt declines CXR. Will treat with a prednisone burst. Albuterol inhaler also prescribed. Patient is aware he should not use his inhaler with his nebulizer. Pt is instructed to follow up with his primary physician. Pt is to return for any worsening signs or symptoms or any other concerns. Pt verbalized understanding of this plan and agrees with plan.         This note was transcribed using speech recognition software. Please contact us for clarification if any questions arise relating to the wording of this document.

## 2015-11-30 NOTE — Telephone Encounter (Signed)
Hello, my name is Becky and I am calling from [facility].  We are calling to check how you are doing after being seen here? We hope that you are feeling better and thank you for choosing us for your urgent care. You will be receiving a survey in the mail or via email and we would greatly appreciate your feedback.  If you have any questions about your visit with us, please call us back at #[phone].

## 2016-01-20 ENCOUNTER — Encounter: Payer: BC Managed Care – PPO | Attending: MD

## 2016-01-26 ENCOUNTER — Other Ambulatory Visit: Admit: 2016-01-26 | Discharge: 2016-01-26 | Payer: BC Managed Care – PPO

## 2016-01-26 DIAGNOSIS — Z125 Encounter for screening for malignant neoplasm of prostate: Secondary | ICD-10-CM

## 2016-01-26 LAB — COMPREHENSIVE METABOLIC PANEL
ALT - Alanine Aminotransferase: 11 IU/L (ref 7–52)
AST - Aspartate Aminotransferase: 17 IU/L (ref 10–50)
Albumin/Globulin Ratio: 1.5 (ref >0.9)
Albumin: 3.7 g/dL (ref 3.5–5.0)
Alkaline Phosphatase: 59 IU/L (ref 34–104)
Anion Gap: 8 mmol/L (ref 3.0–11.0)
BUN: 24 mg/dL — ABNORMAL HIGH (ref 6–23)
Bilirubin Total: 0.5 mg/dL (ref 0.3–1.2)
CO2 - Carbon Dioxide: 26 mmol/L (ref 21.0–31.0)
Calcium: 8.8 mg/dL (ref 8.6–10.3)
Chloride: 108 mmol/L (ref 98–111)
Creatinine: 1.06 mg/dL (ref 0.65–1.30)
GFR Estimate: >60 mL/min/1.73m*2 (ref >=60)
Globulin: 2.4 g/dL (ref 2.2–3.7)
Glucose: 88 mg/dL (ref 80–99)
Potassium: 4.2 mmol/L (ref 3.5–5.1)
Protein Total: 6.1 g/dL (ref 6.0–8.0)
Sodium: 142 mmol/L (ref 135–143)

## 2016-01-26 LAB — CBC WITH AUTO DIFFERENTIAL
Basophils %: 1 % (ref 0–2)
Basophils, Absolute: 0.1 10*3/ÂµL (ref 0.0–0.2)
Eosinophils %: 9 % — ABNORMAL HIGH (ref 0–7)
Eosinophils, Absolute: 0.7 10*3/ÂµL (ref 0.0–0.7)
HCT: 42 % (ref 42.0–54.0)
Hemoglobin: 14.3 g/dL (ref 12.0–18.0)
Lymphocytes %: 35 % (ref 25–45)
Lymphocytes, Absolute: 2.6 10*3/ÂµL (ref 1.1–4.3)
MCH: 32.2 pg (ref 27.0–34.0)
MCHC: 34 g/dL (ref 32.0–36.0)
MCV: 94.8 fL (ref 81.0–99.0)
MPV: 9.3 fL (ref 7.4–10.4)
Monocytes %: 8 % (ref 0–12)
Monocytes, Absolute: 0.6 10*3/ÂµL (ref 0.0–1.2)
Neutrophils %: 47 % (ref 35–70)
Neutrophils, Absolute: 3.5 10*3/ÂµL (ref 1.6–7.3)
Platelet Count: 192 10*3/ÂµL (ref 150–400)
RBC: 4.43 10*6/ÂµL — ABNORMAL LOW (ref 4.70–6.10)
RDW: 13.4 % (ref 11.5–14.5)
WBC: 7.5 10*3/ÂµL (ref 4.8–10.8)

## 2016-01-26 LAB — CORONARY RISK LIPID PANEL
Cholesterol, HDL: 42 mg/dL (ref >=40)
Cholesterol: 166 mg/dL (ref <200)
LDL Calculated: 108 mg/dL — ABNORMAL HIGH (ref <100)
Triglyceride: 82 mg/dL (ref 30–149)

## 2016-01-26 LAB — PSA, TOTAL SCREENING: Prostate Specific Antigen, Total: 0.9 ng/mL (ref 0.00–4.00)

## 2016-01-26 NOTE — Telephone Encounter (Signed)
-----   Message from Cyndia Diver, MD sent at 01/26/2016 12:53 PM PST -----  Labs look good. No signs of renal insufficiency or liver swelling or diabetes. Normal PSA. No signs of prostate cancer. Cholesterol level looks good as well. No signs of anemia or leukemia.

## 2016-01-27 ENCOUNTER — Ambulatory Visit: Admit: 2016-01-27 | Discharge: 2016-01-27 | Payer: BC Managed Care – PPO | Attending: MD

## 2016-01-27 DIAGNOSIS — Z0000 Encounter for general adult medical examination without abnormal findings: Secondary | ICD-10-CM

## 2016-01-27 NOTE — Progress Notes (Signed)
Wesley Booker is a 56 y.o. male being seen today for Physical       HISTORY OF PRESENT ILLNESS  HPI    Wasn't gentleman is here for physical examination. Overall he has been feeling quite well. He has had no headaches or chest pain or shortness of breath symptoms. Denies any melena or hematochezia. His last colonoscopy was at age 56 and was essentially negative.        Past Medical History:   Diagnosis Date   . Allergic rhinitis    . Arthritis     bilateral knee arthritis and ankle arthritis   . Dyslipidemia     on crestor     Past Surgical History:   Procedure Laterality Date   . MENISCECTOMY Bilateral    . menisectomy Bilateral    . VASECTOMY       Family History   Problem Relation Age of Onset   . Osteoporosis Mother    . Hyperlipidemia Mother    . Hypertension Mother    . Heart disease Father    . Skin cancer Father    . Heart disease Brother    . Skin cancer Maternal Aunt    . Heart disease Maternal Grandfather    . Heart disease Paternal Grandfather      Social History     Social History Main Topics   . Smoking status: Current Some Day Smoker   . Smokeless tobacco: Never Used      Comment: 1 Cigar a week   . Alcohol use No   . Drug use: No   . Sexual activity: Yes     Partners: Female     Social History   . Marital status: Married     Spouse name: N/A   . Number of children: N/A   . Years of education: N/A     Other Topics Concern   . Not on file       No outpatient prescriptions have been marked as taking for the 01/27/16 encounter (Office Visit) with Cyndia Diver, MD.     No Known Allergies    Lab Walk-In on 01/26/2016   Component Date Value Ref Range Status   . PSA 01/26/2016 0.90  0.00 - 4.00 ng/mL Final   . WBC 01/26/2016 7.5  4.8 - 10.8 10*3/?L Final   . RBC 01/26/2016 4.43* 4.70 - 6.10 10*6/?L Final   . Hemoglobin 01/26/2016 14.3  12.0 - 18.0 g/dL Final   . HCT 96/04/5407 42.0  42.0 - 54.0 % Final   . MCV 01/26/2016 94.8  81.0 - 99.0 fL Final   . MCH 01/26/2016 32.2  27.0 - 34.0 pg Final   . MCHC  01/26/2016 34.0  32.0 - 36.0 g/dL Final   . RDW 81/19/1478 13.4  11.5 - 14.5 % Final   . Platelet Count 01/26/2016 192  150 - 400 10*3/?L Final   . MPV 01/26/2016 9.3  7.4 - 10.4 fL Final   . Neutrophils % 01/26/2016 47  35 - 70 % Final   . Lymphocytes % 01/26/2016 35  25 - 45 % Final   . Monocytes % 01/26/2016 8  0 - 12 % Final   . Eosinophils % 01/26/2016 9* 0 - 7 % Final   . Basophils % 01/26/2016 1  0 - 2 % Final   . Neutrophils, Absolute 01/26/2016 3.5  1.6 - 7.3 10*3/?L Final   . Lymphocytes, Absolute 01/26/2016 2.6  1.1 - 4.3 10*3/?L Final   .  Monocytes, Absolute 01/26/2016 0.6  0.0 - 1.2 10*3/?L Final   . Eosinophils, Absolute 01/26/2016 0.7  0.0 - 0.7 10*3/?L Final   . Basophils, Absolute 01/26/2016 0.1  0.0 - 0.2 10*3/?L Final   . Differential Type 01/26/2016 Automated Differential   Final   . Sodium 01/26/2016 142  135 - 143 mmol/L Final   . Potassium 01/26/2016 4.2  3.5 - 5.1 mmol/L Final   . Chloride 01/26/2016 108  98 - 111 mmol/L Final   . CO2 - Carbon Dioxide 01/26/2016 26.0  21.0 - 31.0 mmol/L Final   . Glucose 01/26/2016 88  80 - 99 mg/dL Final   . BUN 16/10/9602 24* 6 - 23 mg/dL Final   . Creatinine 54/09/8117 1.06  0.65 - 1.30 mg/dL Final   . Calcium 14/78/2956 8.8  8.6 - 10.3 mg/dL Final   . AST - Aspartate Aminotransferase 01/26/2016 17  10 - 50 IU/L Final   . ALT - Alanine Aminotransferase 01/26/2016 11  7 - 52 IU/L Final   . Alkaline Phosphatase 01/26/2016 59  34 - 104 IU/L Final   . Bilirubin Total 01/26/2016 0.5  0.3 - 1.2 mg/dL Final   . Protein Total 01/26/2016 6.1  6.0 - 8.0 g/dL Final   . Albumin 21/30/8657 3.7  3.5 - 5.0 g/dL Final   . Globulin 84/69/6295 2.4  2.2 - 3.7 g/dL Final   . Albumin/Globulin Ratio 01/26/2016 1.5  >0.9 Final   . Anion Gap 01/26/2016 8.0  3.0 - 11.0 mmol/L Final   . GFR Estimate 01/26/2016 >60  >=60 mL/min/1.49m*2 Final   . GFR Additional Info 01/26/2016    Final   . Triglyceride 01/26/2016 82  30 - 149 mg/dL Final   . Cholesterol 28/41/3244 166  <200 mg/dL  Final   . Cholesterol, HDL 01/26/2016 42  >=40 mg/dL Final   . LDL Calculated 01/26/2016 010* <100 mg/dL Final       Health Maintenance  Health Maintenance Due   Topic Date Due   . COLORECTAL CANCER SCREENING  07/03/2009   . INFLUENZA VACCINE  09/12/2015       REVIEW OF SYSTEMS  Review of Systems   Constitutional: Negative.  Negative for activity change, appetite change, chills, diaphoresis, fatigue, fever and unexpected weight change.   HENT: Negative.  Negative for congestion, dental problem, ear pain, hearing loss, mouth sores, nosebleeds, postnasal drip, sinus pressure, sneezing, sore throat, tinnitus and voice change.    Eyes: Negative.  Negative for photophobia, pain, discharge and visual disturbance.   Respiratory: Negative.  Negative for cough, chest tightness, shortness of breath and wheezing.    Cardiovascular: Negative.  Negative for chest pain, palpitations and leg swelling.   Gastrointestinal: Negative.  Negative for abdominal distention, abdominal pain, anal bleeding, blood in stool, constipation, diarrhea, nausea, rectal pain and vomiting.   Endocrine: Negative.  Negative for cold intolerance, heat intolerance, polydipsia and polyuria.   Genitourinary: Negative.  Negative for decreased urine volume, dysuria, flank pain, frequency, genital sores, hematuria, penile pain, penile swelling, scrotal swelling, testicular pain and urgency.   Musculoskeletal: Negative.  Negative for arthralgias, back pain, joint swelling, myalgias and neck stiffness.   Skin: Negative.  Negative for rash.   Allergic/Immunologic: Negative.  Negative for environmental allergies.   Neurological: Negative.  Negative for dizziness, tremors, seizures, syncope, weakness, numbness and headaches.   Hematological: Negative.  Negative for adenopathy. Does not bruise/bleed easily.   Psychiatric/Behavioral: Negative.  Negative for behavioral problems, dysphoric mood, sleep disturbance and  suicidal ideas. The patient is not nervous/anxious.       12 point review of systems except as noted above    PHYSICAL EXAM  BP 110/68 (BP Location: Left arm, Patient Position: Sitting)   Pulse 79   Temp 36.7 ?C (98 ?F) (Oral)   Resp 16   Ht 5' 10 (1.778 m)   Wt 230 lb (104.3 kg)   SpO2 98%   BMI 33.00 kg/m?   Facility age limit for growth percentiles is 20 years.  No exam data present    Physical Exam   Constitutional: He is oriented to person, place, and time. He appears well-developed and well-nourished. No distress.   HENT:   Head: Normocephalic and atraumatic.   Right Ear: External ear normal.   Left Ear: External ear normal.   Nose: Nose normal.   Mouth/Throat: Oropharynx is clear and moist. No oropharyngeal exudate.   Eyes: Conjunctivae, EOM and lids are normal. Pupils are equal, round, and reactive to light. Right eye exhibits no exudate. Left eye exhibits no exudate.   Neck: Trachea normal, normal range of motion and full passive range of motion without pain. Neck supple. No JVD present. No muscular tenderness present. Carotid bruit is not present. No edema, no erythema and normal range of motion present. No thyroid mass and no thyromegaly present.   Cardiovascular: Normal rate, regular rhythm, S1 normal, S2 normal, normal heart sounds and normal pulses.  Exam reveals no gallop and no friction rub.    No murmur heard.  Pulmonary/Chest: Effort normal and breath sounds normal. No accessory muscle usage. No respiratory distress. He has no wheezes. He has no rales.   Abdominal: Soft. Bowel sounds are normal. He exhibits no distension and no mass. There is no hepatosplenomegaly. There is no tenderness. There is no rebound, no guarding and no CVA tenderness. Hernia confirmed negative in the right inguinal area and confirmed negative in the left inguinal area.   Genitourinary: Rectum normal, prostate normal, testes normal and penis normal. Rectal exam shows no external hemorrhoid, no mass, no tenderness and guaiac negative stool. Prostate is not enlarged and  not tender. Right testis shows no mass, no swelling and no tenderness. Left testis shows no mass, no swelling and no tenderness. Circumcised. No penile tenderness. No discharge found.   Musculoskeletal: Normal range of motion. He exhibits no edema or tenderness.        Right shoulder: He exhibits normal range of motion, no tenderness, no bony tenderness, no swelling, no deformity, no pain and normal strength.   Lymphadenopathy:     He has no cervical adenopathy.        Right: No inguinal and no supraclavicular adenopathy present.        Left: No inguinal and no supraclavicular adenopathy present.   Neurological: He is alert and oriented to person, place, and time. He has normal strength and normal reflexes. No cranial nerve deficit or sensory deficit. He displays a negative Romberg sign.   Skin: Skin is warm and dry. No ecchymosis and no rash noted. No erythema.   Psychiatric: He has a normal mood and affect. His speech is normal and behavior is normal. Judgment and thought content normal. Cognition and memory are normal.   Vitals reviewed.      ASSESSMENT & PLAN  Problem List Items Addressed This Visit        Other    BMI 33.0-33.9,adult    Current smoker on some days  Other Visit Diagnoses     Annual physical exam    -  Primary  Physical examination was performed from today laboratory studies were reviewed with him. I did recommend weight loss via diet and exercise.         No Follow-up on file.  Marland Kitchen

## 2016-02-20 ENCOUNTER — Ambulatory Visit: Admit: 2016-02-20 | Discharge: 2016-02-20 | Payer: BC Managed Care – PPO | Attending: Family

## 2016-02-20 DIAGNOSIS — J4540 Moderate persistent asthma, uncomplicated: Secondary | ICD-10-CM

## 2016-02-20 MED ORDER — pseudoephedrine (SUDAFED) 30 MG tablet
30 | ORAL_TABLET | ORAL | 0 refills | 30.00000 days | Status: AC | PRN
Start: 2016-02-20 — End: ?

## 2016-02-20 MED ORDER — predniSONE (DELTASONE) 10 mg tablet pack
10 | ORAL_TABLET | Freq: Every day | ORAL | 0 refills | 11.50000 days | Status: AC
Start: 2016-02-20 — End: 2016-02-29

## 2016-02-20 MED ORDER — ipratropium-albuterol (DUO-NEB) 0.5 mg-3 mg(2.5 mg base)/3 mL nebulizer solution 3 mL
0.5 | Freq: Once | RESPIRATORY_TRACT | Status: AC
Start: 2016-02-20 — End: 2016-02-20
  Administered 2016-02-20: 18:00:00 0.5 mL via RESPIRATORY_TRACT

## 2016-02-20 MED ORDER — ipratropium-albuterol (DUO-NEB) 0.5 mg-3 mg(2.5 mg base)/3 mL nebulizer solution
0.5 | INJECTION | Freq: Four times a day (QID) | RESPIRATORY_TRACT | 2 refills | 7.50000 days | Status: AC | PRN
Start: 2016-02-20 — End: 2017-02-14

## 2016-02-20 NOTE — Discharge Instructions (Signed)
Patient Education     Asthma, Acute Bronchospasm  Acute bronchospasm caused by asthma is also referred to as an asthma attack. Bronchospasm means your air passages become narrowed. The narrowing is caused by inflammation and tightening of the muscles in the air tubes (bronchi) in your lungs. This can make it hard to breathe or cause you to wheeze and cough.  CAUSES  Possible triggers are:  ? Animal dander from the skin, hair, or feathers of animals.  ? Dust mites contained in house dust.  ? Cockroaches.  ? Pollen from trees or grass.  ? Mold.  ? Cigarette or tobacco smoke.  ? Air pollutants such as dust, household cleaners, hair sprays, aerosol sprays, paint fumes, strong chemicals, or strong odors.  ? Cold air or weather changes. Cold air may trigger inflammation. Winds increase molds and pollens in the air.  ? Strong emotions such as crying or laughing hard.  ? Stress.  ? Certain medicines such as aspirin or beta-blockers.  ? Sulfites in foods and drinks, such as dried fruits and wine.  ? Infections or inflammatory conditions, such as a flu, cold, or inflammation of the nasal membranes (rhinitis).  ? Gastroesophageal reflux disease (GERD). GERD is a condition where stomach acid backs up into your esophagus.  ? Exercise or strenuous activity.  SIGNS AND SYMPTOMS   ? Wheezing.  ? Excessive coughing, particularly at night.  ? Chest tightness.  ? Shortness of breath.  DIAGNOSIS   Your health care provider will ask you about your medical history and perform a physical exam. A chest X-ray or blood testing may be performed to look for other causes of your symptoms or other conditions that may have triggered your asthma attack.?  TREATMENT   Treatment is aimed at reducing inflammation and opening up the airways in your lungs. ?Most asthma attacks are treated with inhaled medicines. These include quick relief or rescue medicines (such as bronchodilators) and controller medicines (such as inhaled corticosteroids). These  medicines are sometimes given through an inhaler or a nebulizer. Systemic steroid medicine taken by mouth or given through an IV tube also can be used to reduce the inflammation when an attack is moderate or severe. Antibiotic medicines are only used if a bacterial infection is present.   HOME CARE INSTRUCTIONS   ? Rest.  ? Drink plenty of liquids. This helps the mucus to remain thin and be easily coughed up. Only use caffeine in moderation and do not use alcohol until you have recovered from your illness.  ? Do not smoke. Avoid being exposed to secondhand smoke.  ? You play a critical role in keeping yourself in good health. Avoid exposure to things that cause you to wheeze or to have breathing problems.  ? Keep your medicines up-to-date and available. Carefully follow your health care provider's treatment plan.  ? Take your medicine exactly as prescribed.  ? When pollen or pollution is bad, keep windows closed and use an air conditioner or go to places with air conditioning.  ? Asthma requires careful medical care. See your health care provider for a follow-up as advised. If you are more than [redacted] weeks pregnant and you were prescribed any new medicines, let your obstetrician know about the visit and how you are doing. Follow up with your health care provider as directed.  ? After you have recovered from your asthma attack, make an appointment with your outpatient doctor to talk about ways to reduce the likelihood of future attacks. If   you do not have a doctor who manages your asthma, make an appointment with a primary care doctor to discuss your asthma.  SEEK IMMEDIATE MEDICAL CARE IF:   ? You are getting worse.  ? You have trouble breathing. If severe, call your local emergency services (911 in the [place]  ? You develop chest pain or discomfort.  ? You are vomiting.  ? You are not able to keep fluids down.  ? You are coughing up yellow, green, brown, or bloody sputum.  ? You have a fever and your symptoms suddenly  get worse.  ? You have trouble swallowing.  MAKE SURE YOU:   ? Understand these instructions.  ? Will watch your condition.  ? Will get help right away if you are not doing well or get worse.     This information is not intended to replace advice given to you by your health care provider. Make sure you discuss any questions you have with your health care provider.     Document Released: 04/14/2006 Document Revised: 01/02/2013 Document Reviewed: 07/05/2012  Elsevier Interactive Patient Education ?2017 [employer]

## 2016-02-20 NOTE — Progress Notes (Signed)
Wesley Booker is a 56 y.o. male, DOB 1960-10-15, who presents today for Cough (1 week)  .    HISTORY OF PRESENT ILLNESS:    Cough   This is a new problem. The current episode started in the past 7 days. The problem has been gradually worsening. The cough is productive of sputum. Associated symptoms include nasal congestion, postnasal drip, rhinorrhea, a sore throat, shortness of breath and wheezing. Pertinent negatives include no chest pain, chills, ear congestion, fever, heartburn, hemoptysis, rash, sweats or weight loss. The symptoms are aggravated by lying down and cold air. Risk factors for lung disease include smoking/tobacco exposure. He has tried ipratropium inhaler and body position changes for the symptoms. The treatment provided mild relief. His past medical history is significant for asthma and environmental allergies. There is no history of bronchitis, COPD, emphysema or pneumonia.     Past Medical History:   Diagnosis Date   . Allergic rhinitis    . Arthritis     bilateral knee arthritis and ankle arthritis   . Dyslipidemia     on crestor     Past Surgical History:   Procedure Laterality Date   . MENISCECTOMY Bilateral    . menisectomy Bilateral    . VASECTOMY       Family History   Problem Relation Age of Onset   . Osteoporosis Mother    . Hyperlipidemia Mother    . Hypertension Mother    . Heart disease Father    . Skin cancer Father    . Heart disease Brother    . Skin cancer Maternal Aunt    . Heart disease Maternal Grandfather    . Heart disease Paternal Grandfather      Social History     Social History   . Marital status: Married     Spouse name: N/A   . Number of children: N/A   . Years of education: N/A     Occupational History   . Not on file.     Social History Main Topics   . Smoking status: Current Some Day Smoker   . Smokeless tobacco: Never Used      Comment: 1 Cigar a month, not exposed    . Alcohol use No   . Drug use: No   . Sexual activity: Yes     Partners: Female     Other Topics  Concern   . Not on file     Social History Narrative   . No narrative on file           REVIEW OF SYSTEMS:    Review of Systems   Constitutional: Negative for chills, fever and weight loss.   HENT: Positive for postnasal drip, rhinorrhea and sore throat.    Eyes: Negative.    Respiratory: Positive for cough, shortness of breath and wheezing. Negative for hemoptysis.    Cardiovascular: Negative.  Negative for chest pain.   Gastrointestinal: Negative for diarrhea, heartburn, nausea and vomiting.   Genitourinary: Negative for difficulty urinating, frequency, hematuria and urgency.   Musculoskeletal: Negative for neck pain and neck stiffness.   Skin: Negative for rash.   Allergic/Immunologic: Positive for environmental allergies.   Neurological: Positive for light-headedness. Negative for dizziness and facial asymmetry.   Psychiatric/Behavioral: Negative for agitation, behavioral problems and confusion.          PHYSICAL EXAM:    Vitals:    02/20/16 0954   BP: 104/72   Pulse: 74  Resp: 20   Temp: 36.8 ?C (98.2 ?F)   SpO2: 97%    Body mass index is 33 kg/m?Marland Kitchen  Physical Exam   Constitutional: Vital signs are normal. He appears well-developed and well-nourished. He is active.   Eyes: Conjunctivae, EOM and lids are normal. Pupils are equal, round, and reactive to light.   Neck: Full passive range of motion without pain. Neck supple. No edema and no erythema present.   Cardiovascular: Normal rate, regular rhythm, S1 normal and S2 normal.    Pulmonary/Chest: No accessory muscle usage. No respiratory distress. He has wheezes in the right upper field, the right lower field, the left upper field, the left middle field and the left lower field. He has rhonchi in the left middle field and the left lower field.   Neurological: He is alert.   Skin: Skin is warm, dry and intact. No abrasion, no bruising and no rash noted. No cyanosis. Nails show no clubbing.   Psychiatric: He has a normal mood and affect. His speech is normal and  behavior is normal. Judgment and thought content normal. Cognition and memory are normal.         ASSESSMENT & PLAN:  Patient presents with wheezing throughout bilateral lung fields, he is afebrile at this time. He has not been consistent with his regular asthma medications. He is treated in clinic with a DuoNeb and his lung fields improved significantly. I have reviewed his chest x-ray and found no acute pneumonia or signs of infection or fluid. I did review this x-ray with the patient at the bedside he is educated to his new medications and given discharge instructions that were reviewed at bedside. He states that he understands the importance of utilizing his medications daily and consistently for his asthma control and have instructed him to follow up with his primary care physician as needed and with this office if symptoms worsen.      No results found for this or any previous visit (from the past 6 hour(s)).  No results found.      ICD-9-CM ICD-10-CM    1. Moderate persistent asthma, unspecified whether complicated 493.90 J45.40 pseudoephedrine (SUDAFED) 30 MG tablet   2. Cough 786.2 R05 ipratropium-albuterol (DUO-NEB) 0.5 mg-3 mg(2.5 mg base)/3 mL nebulizer solution 3 mL      X-ray chest PA and lateral      X-ray chest PA and lateral      predniSONE (DELTASONE) 10 mg tablet pack         This note was transcribed using speech recognition software. Please contact us for clarification if any questions arise relating to the wording of this document.

## 2016-02-22 NOTE — Telephone Encounter (Signed)
Urgent Care Patient Call Back    PATIENT ANSWERS -  Hello, my name is Wesley Booker and I am calling from [facility] asking how you are feeling after being seen here on 02/20/16?     Are you having any problems or questions with the medications that we were prescribed, if any? N    Do you have a follow up appointment scheduled? N     If yes, when and with whom?    If no, can we help you with that? If unable to see your PCP you can return here  to the Urgent Care for re-evaluation.     Do you have any further questions about your visit that I can ask the Provider for you? N    Please know that you will be receiving a survey either by mail or e-mail and we greatly appreciate your response so that we can continue to grow. Thank you for choosing [facility] for your urgent healthcare needs.

## 2016-03-08 MED ORDER — rosuvastatin (CRESTOR) 10 MG tablet
10 | ORAL_TABLET | Freq: Every day | ORAL | 3 refills | Status: AC
Start: 2016-03-08 — End: ?

## 2016-03-08 NOTE — Telephone Encounter (Signed)
Pt has completed lipid panel 1/18 filled for one year.

## 2016-03-08 NOTE — Telephone Encounter (Signed)
Medication Being Requested:  rosuvastatin (CRESTOR) 10 MG tablet    Send To Pharmacy, or Office Pick Up? [facility]     [facility] - [place], [place] - [address]  [address]  [address]  [place] [address]  Phone: [phone] Fax: [phone]        How many Days Remaining of medication: 12 days     Additional Information: Please call back at [phone]  Okay to leave a detailed message.             **Informed patient that it may take up to 48-72 hours to fill prescription.**    Future Appointments:  Future Appointments  Date Time Provider Department Center   02/02/2017 9:20 AM Cyndia Diver, MD [facility]

## 2016-05-10 ENCOUNTER — Ambulatory Visit: Admit: 2016-05-10 | Discharge: 2016-05-11 | Payer: BC Managed Care – PPO | Attending: FNP

## 2016-05-10 DIAGNOSIS — M79672 Pain in left foot: Secondary | ICD-10-CM

## 2016-05-10 NOTE — Progress Notes (Signed)
Wesley Booker is a 56 y.o. male, DOB Apr 03, 1960, who presents today for Foot Pain (left foot pain no known injury)  .    HISTORY OF PRESENT ILLNESS:    HPI   56 year old male patient presents today with pain in his left foot. Patient states he has no injury or trauma to the left foot. Patient states it has been over the past one week. Patient states it has become more and more painful. Patient states it's finally wakes up in the morning when he walks it becomes tender to the ball of his left foot and into his second toe. Patient states is a history of gout. Patient has not used any medications for his symptoms.  Past Medical History:   Diagnosis Date   . Allergic rhinitis    . Arthritis     bilateral knee arthritis and ankle arthritis   . Dyslipidemia     on crestor     Past Surgical History:   Procedure Laterality Date   . MENISCECTOMY Bilateral    . menisectomy Bilateral    . VASECTOMY       Social History     Social History   . Marital status: Married     Spouse name: N/A   . Number of children: N/A   . Years of education: N/A     Occupational History   . Not on file.     Social History Main Topics   . Smoking status: Current Some Day Smoker     Types: Cigars   . Smokeless tobacco: Never Used      Comment: 1 Cigar a month, not exposed    . Alcohol use No   . Drug use: No   . Sexual activity: Yes     Partners: Female     Other Topics Concern   . Not on file     Social History Narrative   . No narrative on file     Current Outpatient Prescriptions on File Prior to Visit   Medication Sig Dispense Refill   . albuterol (PROVENTIL HFA;VENTOLIN HFA;PROAIR HFA) 90 mcg/actuation inhaler Inhale 2 puffs into the lungs every 4 hours as needed for Wheezing (Cough). 1 Inhaler 0   . albuterol (PROVENTIL) 2.5 mg /3 mL (0.083 %) nebulizer solution Take 3 mLs (2.5 mg total) by nebulization every 4 (four) hours as needed for Wheezing or Shortness of Breath (as needed). 120 vial 3   . ascorbic acid, vitamin C, (VITAMIN C) 1000 MG  tablet Take 1 tablet (1,000 mg total) by mouth daily.     Marland Kitchen docusate sodium (COLACE) 100 MG capsule Take 100 mg by mouth 2 (two) times daily.     Marland Kitchen ipratropium-albuterol (DUO-NEB) 0.5 mg-3 mg(2.5 mg base)/3 mL nebulizer solution Take 3 mL by nebulization every 6 hours as needed for Wheezing. 100 ampule 2   . naproxen sodium (ALEVE) 220 MG tablet Take 1 tablet (220 mg total) by mouth daily.     . pseudoephedrine (SUDAFED) 30 MG tablet Take 1 tablet by mouth every 4 hours as needed for Congestion. 30 tablet 0   . rosuvastatin (CRESTOR) 10 MG tablet Take 1 tablet by mouth daily. 90 tablet 3     No current facility-administered medications on file prior to visit.      Family History   Problem Relation Age of Onset   . Osteoporosis Mother    . Hyperlipidemia Mother    . Hypertension Mother    . Heart  disease Father    . Skin cancer Father    . Heart disease Brother    . Skin cancer Maternal Aunt    . Heart disease Maternal Grandfather    . Heart disease Paternal Grandfather          REVIEW OF SYSTEMS:    Review of Systems   Constitutional: Positive for activity change.   HENT: Negative.    Respiratory: Negative.    Genitourinary: Negative.    Musculoskeletal: Negative.    Skin: Positive for color change.        Erythema and warmth of the tissue of the left foot   Neurological: Negative.    Psychiatric/Behavioral: Negative.           PHYSICAL EXAM:    Vitals:    05/10/16 1648   BP: 114/75   Pulse: 58   Resp: 16   Temp: 36.8 ?C (98.3 ?F)   SpO2: 98%    Body mass index is 32.28 kg/m?Marland Kitchen  Physical Exam   Constitutional: He is oriented to person, place, and time. He appears well-developed and well-nourished.   HENT:   Head: Normocephalic.   Right Ear: Hearing normal.   Left Ear: Hearing normal.   Nose: Nose normal.   Mouth/Throat: Mucous membranes are normal.   Eyes: Pupils are equal, round, and reactive to light.   Neck: Normal range of motion.   Cardiovascular: Normal rate and regular rhythm.    Pulmonary/Chest: Effort normal  and breath sounds normal.   Musculoskeletal: Normal range of motion.        Left foot: There is tenderness and swelling.        Feet:    Neurological: He is alert and oriented to person, place, and time.   Skin: Skin is warm, dry and intact.   Psychiatric: He has a normal mood and affect. His speech is normal and behavior is normal. Judgment and thought content normal.   Nursing note and vitals reviewed.        ASSESSMENT & PLAN:  Patient is well-appearing and pleasant for exam. Patient does appear to have an inflammatory response versus cellulitis. No family history of gout per patient. Labs are drawn. All unremarkable for any significant findings. Patient will be treated with prednisone. Patient is return in 2 days if he is not seeing a significant decrease. Patient is also advised to use elevation. Patient should call his primary care physician for an appointment in a week. Patient is discharged home.  Recent Results (from the past 6 hour(s))   ESR (Sed Rate) -STAT    Collection Time: 05/10/16  5:11 PM   Result Value Ref Range    Sed Rate 13 0 - 20 mm/hr   CBC with Auto Differential -STAT    Collection Time: 05/10/16  5:11 PM   Result Value Ref Range    WBC 8.6 4.8 - 10.8 10*3/?L    RBC 4.35 (L) 4.70 - 6.10 10*6/?L    Hemoglobin 13.9 12.0 - 18.0 g/dL    HCT 16.1 (L) 09.6 - 54.0 %    MCV 95.0 81.0 - 99.0 fL    MCH 31.9 27.0 - 34.0 pg    MCHC 33.6 32.0 - 36.0 g/dL    RDW 04.5 40.9 - 81.1 %    Platelet Count 199 150 - 400 10*3/?L    MPV 8.5 7.4 - 10.4 fL    Neutrophils % 46 35 - 70 %    Lymphocytes % 41 25 -  45 %    Monocytes % 7 0 - 12 %    Eosinophils % 6 0 - 7 %    Basophils % 1 0 - 2 %    Neutrophils, Absolute 3.9 1.6 - 7.3 10*3/?L    Lymphocytes, Absolute 3.5 1.1 - 4.3 10*3/?L    Monocytes, Absolute 0.6 0.0 - 1.2 10*3/?L    Eosinophils, Absolute 0.5 0.0 - 0.7 10*3/?L    Basophils, Absolute 0.1 0.0 - 0.2 10*3/?L    Differential Type Automated Differential    Uric Acid -STAT    Collection Time: 05/10/16  5:11 PM     Result Value Ref Range    Uric Acid 4.4 4.4 - 7.6 mg/dL     No images are attached to the encounter.    1. Left foot pain  - X-ray foot left AP and lateral; Future  - ESR (Sed Rate) -STAT; Future  - CBC with Auto Differential -STAT; Future  - Uric Acid -STAT; Future  - ESR (Sed Rate) -STAT  - CBC with Auto Differential -STAT  - Uric Acid -STAT  - X-ray foot left AP and lateral  - predniSONE (DELTASONE) 20 MG tablet; Take 20mg  PO twice a day for 4 days, 40mg  daily for 3 days, then 20mg  daily for 3 days,  And then 10 mg for 3 days and then STOP  Dispense: 18 tablet; Refill: 0    2. Current smoker    X-ray Foot Left Ap And Lateral    Result Date: 05/10/2016  XR FOOT LEFT AP AND LATERAL 05/10/2016 5:43 PM PROVIDED CLINICAL INDICATIONS:  left 2nd toe pain, no injury Pain in left foot ADDITIONAL CLINICAL HISTORY:  None. COMPARISON:  None. FINDINGS:  The mineralization of bone is normal. The alignment of the digits is intact. No erosive changes or joint space narrowing are present. The Lisfranc articulations align appropriate. There are no fractures or angulation deformities.     IMPRESSION:  Negative LEFT foot series.       This note was transcribed using speech recognition software. Please contact us for clarification if any questions arise relating to the wording of this document.

## 2016-05-10 NOTE — Discharge Instructions (Signed)
Patient Education     Foot Pain  Many things can cause foot pain. Some common causes are:  ? An injury.  ? A sprain.  ? Arthritis.  ? Blisters.  ? Bunions.  HOME CARE INSTRUCTIONS  Pay attention to any changes in your symptoms. Take these actions to help with your discomfort:  ? If directed, put ice on the affected area:    Put ice in a plastic bag.    Place a towel between your skin and the bag.    Leave the ice on for 15-20 minutes, 3-4 times a day for 2 days.  ? Take over-the-counter and prescription medicines only as told by your health care provider.  ? Wear comfortable, supportive shoes that fit you well. Do not wear high heels.  ? Do not stand or walk for long periods of time.  ? Do not lift a lot of weight. This can put added pressure on your feet.  ? Do stretches to relieve foot pain and stiffness as told by your health care provider.  ? Rub your foot gently.  ? Keep your feet clean and dry.  SEEK MEDICAL CARE IF:  ? Your pain does not get better after a few days of self-care.  ? Your pain gets worse.  ? You cannot stand on your foot.  SEEK IMMEDIATE MEDICAL CARE IF:  ? Your foot is numb or tingling.  ? Your foot or toes are swollen.  ? Your foot or toes turn white or blue.  ? You have warmth and redness along your foot.     This information is not intended to replace advice given to you by your health care provider. Make sure you discuss any questions you have with your health care provider.     Document Released: 01/24/2015 Document Reviewed: 01/24/2015  Elsevier Interactive Patient Education ?2017 Elsevier Inc[employer]

## 2016-05-11 LAB — CBC WITH AUTO DIFFERENTIAL
Basophils %: 1 % (ref 0–2)
Basophils, Absolute: 0.1 10*3/ÂµL (ref 0.0–0.2)
Eosinophils %: 6 % (ref 0–7)
Eosinophils, Absolute: 0.5 10*3/ÂµL (ref 0.0–0.7)
HCT: 41.3 % — ABNORMAL LOW (ref 42.0–54.0)
Hemoglobin: 13.9 g/dL (ref 12.0–18.0)
Lymphocytes %: 41 % (ref 25–45)
Lymphocytes, Absolute: 3.5 10*3/ÂµL (ref 1.1–4.3)
MCH: 31.9 pg (ref 27.0–34.0)
MCHC: 33.6 g/dL (ref 32.0–36.0)
MCV: 95 fL (ref 81.0–99.0)
MPV: 8.5 fL (ref 7.4–10.4)
Monocytes %: 7 % (ref 0–12)
Monocytes, Absolute: 0.6 10*3/ÂµL (ref 0.0–1.2)
Neutrophils %: 46 % (ref 35–70)
Neutrophils, Absolute: 3.9 10*3/ÂµL (ref 1.6–7.3)
Platelet Count: 199 10*3/ÂµL (ref 150–400)
RBC: 4.35 10*6/ÂµL — ABNORMAL LOW (ref 4.70–6.10)
RDW: 13.3 % (ref 11.5–14.5)
WBC: 8.6 10*3/ÂµL (ref 4.8–10.8)

## 2016-05-11 LAB — URIC ACID: Uric Acid: 4.4 mg/dL (ref 4.4–7.6)

## 2016-05-11 LAB — ERYTHROCYTE SEDIMENTATION RATE, AUTOMATED: Erythrocyte Sedimentation Rate, Automated: 13 mm/hr (ref 0–20)

## 2016-05-11 MED ORDER — predniSONE (DELTASONE) 20 MG tablet
20 | ORAL_TABLET | ORAL | 0 refills | 11.50000 days | Status: DC
Start: 2016-05-11 — End: 2016-07-24

## 2016-05-12 NOTE — Telephone Encounter (Signed)
Urgent Care Follow Up Call    Urgent Care Follow Up 05/12/2016   UC HOW IS THE PATIENT FEELING Swelling decreasing       No flowsheet data found.    No flowsheet data found.

## 2016-05-21 NOTE — Telephone Encounter (Signed)
Regarding: swelling in toes and pain in toe knuckle- lump in foot near toes  ----- Message from Gypsy Collison sent at 05/21/2016  4:17 PM PDT -----  Jeanene Erb states he  is experiencing swelling in toes and pain in toe knuckle- lump in foot near toes    Patient location: [address]  [place] [address]    Patient call back number is: [phone]    Are you experiencing symptom NOW?   Yes    How long has patient had symptoms: 2 weeks but worsening again.    Can a detailed message be left?  Yes    Has Privacy Policy been signed?  Yes    If no the following statement was read to patient: We are required by federal law to provide you with our notice of privacy practices and that we ask you to acknowledge receipt of this notice. You will receive a copy in the mail and we request that you review, sign, and return your acknowledgment to your primary care clinic.    PSR action:  Warm hand-off of phone call to nurse.

## 2016-05-21 NOTE — Telephone Encounter (Signed)
Left message informing patient Dr. Bujosa is no longer with [facility]. Asked for a call back to schedule a establish appointment with different provider. Letter has been sent.

## 2016-05-21 NOTE — Telephone Encounter (Signed)
RN INTAKE    Verified Patient Name, Date of Birth, Location and phone number of Patient:  Yes [phone] [address]    Describe your symptoms and when did they start?  Onset 05/10/16 -swelling noted to base of toes on LEFT foot. Foot improved after pt started prednisone. Now with return of slight swelling to base of 2nd toe. Pt denies redness or warmth to joint or base of toe. He feels a lump to base of toe when he bears weight. He rates the pain as mild.    Are your symptoms occurring now?  yes     Are you having any other symptoms? None others described. Pt denies fever.    Pertinent medical history and medications? Pt states he has almost completed prednisone taper. He was seen in UC on 05/10/16 for same concern.    Have you recently been seen by your provider? No    RN ASSESSMENT    Please see attached triage protocol checklist    DISPOSITION/PLAN: See Within 2 Weeks  I have offered pt opportunity to talk with scheduler to make an appointment. Pt declines. He understands that he will need to return to [facility] tonight or tomorrow if he notices any redness to foot or increased swelling. I have instructed him to have his spouse assist in monitoring bottom of his foot starting tonight and to go to UC or ER if it is after 8 pm and he notices any change in condition.Pt verbalizes understanding.    Reason for Disposition  . MILD pain (e.g., does not interfere with normal activities) and present > 7 days     Mild pain to base of #2 toe since 05/10/16. Pt should be re-checked within 2 weeks.    Additional Information  . Negative: Pain in the big toe joint     Pt states pain is to base of 2nd toe.    Protocols used: FOOT PAIN-A-[place]

## 2016-05-24 ENCOUNTER — Ambulatory Visit: Admit: 2016-05-24 | Discharge: 2016-05-25 | Payer: BC Managed Care – PPO | Attending: Family

## 2016-05-24 DIAGNOSIS — M79672 Pain in left foot: Secondary | ICD-10-CM

## 2016-05-24 NOTE — Discharge Instructions (Signed)
Patient Education     Cryotherapy  WHAT IS CRYOTHERAPY?  Cryotherapy, or cold therapy, is a treatment that uses cold temperatures to treat an injury or medical condition. It includes using cold packs or ice packs to reduce pain and swelling. Only use cryotherapy if your doctor says it is okay.  HOW DO I USE CRYOTHERAPY?  ? Place a towel between the cold source and your skin.  ? Apply the cold source for no more than 20 minutes at a time.  ? Check your skin after 5 minutes to make sure there are no signs of a poor response to cold or skin damage. Check for:    White spots on your skin. Your skin may look blotchy or mottled.    Skin that looks blue or pale.    Skin that feels waxy or hard.  ? Repeat these steps as many times each day as told by your doctor.  HOW CAN I MAKE A COLD PACK?  When using a cold pack at home to reduce pain and swelling, you can use:  ? A silica gel cold pack that has been left in the freezer. You can buy this online or in stores.  ? A plastic bag of frozen vegetables.  ? A sealable plastic bag that has been filled with crushed ice.  Always wrap the pack in a dry or damp towel to avoid direct contact with your skin.  WHEN SHOULD I CALL MY DOCTOR?  Call your doctor if:  ? You start to have white spots on your skin. This may give your skin a blotchy or mottled look.  ? Your skin turns blue or pale.  ? Your skin becomes waxy or hard.  ? Your swelling gets worse.     This information is not intended to replace advice given to you by your health care provider. Make sure you discuss any questions you have with your health care provider.     Document Released: 06/16/2007 Document Revised: 04/21/2015 Document Reviewed: 09/11/2014  Elsevier Interactive Patient Education ?2017 Elsevier Inc.       Patient Education     Foot Pain  Many things can cause foot pain. Some common causes are:  ? An injury.  ? A sprain.  ? Arthritis.  ? Blisters.  ? Bunions.  HOME CARE INSTRUCTIONS  Pay attention to any changes  in your symptoms. Take these actions to help with your discomfort:  ? If directed, put ice on the affected area:    Put ice in a plastic bag.    Place a towel between your skin and the bag.    Leave the ice on for 15-20 minutes, 3-4 times a day for 2 days.  ? Take over-the-counter and prescription medicines only as told by your health care provider.  ? Wear comfortable, supportive shoes that fit you well. Do not wear high heels.  ? Do not stand or walk for long periods of time.  ? Do not lift a lot of weight. This can put added pressure on your feet.  ? Do stretches to relieve foot pain and stiffness as told by your health care provider.  ? Rub your foot gently.  ? Keep your feet clean and dry.  SEEK MEDICAL CARE IF:  ? Your pain does not get better after a few days of self-care.  ? Your pain gets worse.  ? You cannot stand on your foot.  SEEK IMMEDIATE MEDICAL CARE IF:  ? Your foot is  numb or tingling.  ? Your foot or toes are swollen.  ? Your foot or toes turn white or blue.  ? You have warmth and redness along your foot.     This information is not intended to replace advice given to you by your health care provider. Make sure you discuss any questions you have with your health care provider.     Document Released: 01/24/2015 Document Reviewed: 01/24/2015  Elsevier Interactive Patient Education ?2017 Elsevier Inc.     Patient Education     Gout  Gout is painful swelling that can occur in some of your joints. Gout is a type of arthritis. This condition is caused by having too much uric acid in your body. Uric acid is a chemical that forms when your body breaks down substances called purines. Purines are important for building body proteins.  When your body has too much uric acid, sharp crystals can form and build up inside your joints. This causes pain and swelling. Gout attacks can happen quickly and be very painful (acute gout). Over time, the attacks can affect more joints and become more frequent (chronic  gout). Gout can also cause uric acid to build up under your skin and inside your kidneys.  CAUSES  This condition is caused by too much uric acid in your blood. This can occur because:  ? Your kidneys do not remove enough uric acid from your blood. This is the most common cause.  ? Your body makes too much uric acid. This can occur with some cancers and cancer treatments. It can also occur if your body is breaking down too many red blood cells (hemolytic anemia).  ? You eat too many foods that are high in purines. These foods include organ meats and some seafood. Alcohol, especially beer, is also high in purines.  A gout attack may be triggered by trauma or stress.  RISK FACTORS  This condition is more likely to develop in people who:  ? Have a family history of gout.  ? Are male and middle-aged.  ? Are male and have gone through menopause.  ? Are obese.  ? Frequently drink alcohol, especially beer.  ? Are dehydrated.  ? Lose weight too quickly.  ? Have an organ transplant.  ? Have lead poisoning.  ? Take certain medicines, including aspirin, cyclosporine, diuretics, levodopa, and niacin.  ? Have kidney disease or psoriasis.  SYMPTOMS  An attack of acute gout happens quickly. It usually occurs in just one joint. The most common place is the big toe. Attacks often start at night. Other joints that may be affected include joints of the feet, ankle, knee, fingers, wrist, or elbow. Symptoms may include:  ? Severe pain.  ? Warmth.  ? Swelling.  ? Stiffness.  ? Tenderness. The affected joint may be very painful to touch.  ? Shiny, red, or purple skin.  ? Chills and fever.  Chronic gout may cause symptoms more frequently. More joints may be involved. You may also have white or yellow lumps (tophi) on your hands or feet or in other areas near your joints.  DIAGNOSIS  This condition is diagnosed based on your symptoms, medical history, and physical exam. You may have tests, such as:  ? Blood tests to measure uric acid  levels.  ? Removal of joint fluid with a needle (aspiration) to look for uric acid crystals.  ? X-rays to look for joint damage.  TREATMENT  Treatment for this condition has two phases:  treating an acute attack and preventing future attacks. Acute gout treatment may include medicines to reduce pain and swelling, including:  ? NSAIDs.  ? Steroids. These are strong anti-inflammatory medicines that can be taken by mouth (orally) or injected into a joint.  ? Colchicine. This medicine relieves pain and swelling when it is taken soon after an attack. It can be given orally or through an IV tube.  Preventive treatment may include:  ? Daily use of smaller doses of NSAIDs or colchicine.  ? Use of a medicine that reduces uric acid levels in your blood.  ? Changes to your diet. You may need to see a specialist about healthy eating (dietitian).  HOME CARE INSTRUCTIONS  During a Gout Attack  ? If directed, apply ice to the affected area:    Put ice in a plastic bag.    Place a towel between your skin and the bag.    Leave the ice on for 20 minutes, 2-3 times a day.  ? Rest the joint as much as possible. If the affected joint is in your leg, you may be given crutches to use.  ? Raise (elevate) the affected joint above the level of your heart as often as possible.  ? Drink enough fluids to keep your urine clear or pale yellow.  ? Take over-the-counter and prescription medicines only as told by your health care provider.  ? Do not drive or operate heavy machinery while taking prescription pain medicine.  ? Follow instructions from your health care provider about eating or drinking restrictions.  ? Return to your normal activities as told by your health care provider. Ask your health care provider what activities are safe for you.  Avoiding Future Gout Attacks  ? Follow a low-purine diet as told by your dietitian or health care provider. Avoid foods and drinks that are high in purines, including liver, kidney, anchovies, asparagus,  herring, mushrooms, mussels, and beer.  ? Limit alcohol intake to no more than 1 drink a day for nonpregnant women and 2 drinks a day for men. One drink equals 12 oz of beer, 5 oz of wine, or 1? oz of hard liquor.  ? Maintain a healthy weight or lose weight if you are overweight. If you want to lose weight, talk with your health care provider. It is important that you do not lose weight too quickly.  ? Start or maintain an exercise program as told by your health care provider.  ? Drink enough fluids to keep your urine clear or pale yellow.  ? Take over-the-counter and prescription medicines only as told by your health care provider.  ? Keep all follow-up visits as told by your health care provider. This is important.  SEEK MEDICAL CARE IF:  ? You have another gout attack.  ? You continue to have symptoms of a gout attack after10 days of treatment.  ? You have side effects from your medicines.  ? You have chills or a fever.  ? You have burning pain when you urinate.  ? You have pain in your lower back or belly.  SEEK IMMEDIATE MEDICAL CARE IF:  ? You have severe or uncontrolled pain.  ? You cannot urinate.     This information is not intended to replace advice given to you by your health care provider. Make sure you discuss any questions you have with your health care provider.     Document Released: 12/26/1999 Document Revised: 04/21/2015 Document Reviewed: 10/10/2014  Elsevier Interactive  Patient Education ?2017 Elsevier Inc.

## 2016-05-24 NOTE — Progress Notes (Signed)
Wesley Booker is a 56 y.o. male, DOB [birth year], who presents today for Foot Swelling (left foot pain and swelling done prednisone here 2 weeks ago)  .    HISTORY OF PRESENT ILLNESS:    HPI   Patient presents to the urgent care today complaining of worsening left foot pain again. Patient was seen here in the urgent care on 4-30 and was diagnosed with probable gout. He was given a prednisone taper at that time and finished it about 4 days ago. He states that he was feeling much improved, however when the prednisone ran out he started feeling worse again. At his previous visit patient had blood work as well as imaging studies that revealed no obvious acute gout. Patient has no history of this as well. He has no history of any type of injury. Been using over-the-counter anti-inflammatories with no relief of his pain.    Past Medical History:   Diagnosis Date   . Allergic rhinitis    . Arthritis     bilateral knee arthritis and ankle arthritis   . Dyslipidemia     on crestor     Past Surgical History:   Procedure Laterality Date   . MENISCECTOMY Bilateral    . menisectomy Bilateral    . VASECTOMY       Social History     Social History   . Marital status: Married     Spouse name: N/A   . Number of children: N/A   . Years of education: N/A     Occupational History   . Not on file.     Social History Main Topics   . Smoking status: Current Some Day Smoker     Types: Cigars   . Smokeless tobacco: Never Used      Comment: 1 Cigar a month, not exposed    . Alcohol use No   . Drug use: No   . Sexual activity: Yes     Partners: Female     Other Topics Concern   . Not on file     Social History Narrative   . No narrative on file     Current Outpatient Prescriptions on File Prior to Visit   Medication Sig Dispense Refill   . albuterol (PROVENTIL HFA;VENTOLIN HFA;PROAIR HFA) 90 mcg/actuation inhaler Inhale 2 puffs into the lungs every 4 hours as needed for Wheezing (Cough). 1 Inhaler 0   . albuterol (PROVENTIL) 2.5 mg /3 mL  (0.083 %) nebulizer solution Take 3 mLs (2.5 mg total) by nebulization every 4 (four) hours as needed for Wheezing or Shortness of Breath (as needed). 120 vial 3   . ascorbic acid, vitamin C, (VITAMIN C) 1000 MG tablet Take 1 tablet (1,000 mg total) by mouth daily.     Marland Kitchen docusate sodium (COLACE) 100 MG capsule Take 100 mg by mouth 2 (two) times daily.     Marland Kitchen ipratropium-albuterol (DUO-NEB) 0.5 mg-3 mg(2.5 mg base)/3 mL nebulizer solution Take 3 mL by nebulization every 6 hours as needed for Wheezing. 100 ampule 2   . naproxen sodium (ALEVE) 220 MG tablet Take 1 tablet (220 mg total) by mouth daily.     . predniSONE (DELTASONE) 20 MG tablet Take 20mg  PO twice a day for 4 days, 40mg  daily for 3 days, then 20mg  daily for 3 days,  And then 10 mg for 3 days and then STOP 18 tablet 0   . pseudoephedrine (SUDAFED) 30 MG tablet Take 1 tablet by mouth every  4 hours as needed for Congestion. 30 tablet 0   . rosuvastatin (CRESTOR) 10 MG tablet Take 1 tablet by mouth daily. 90 tablet 3     No current facility-administered medications on file prior to visit.      Family History   Problem Relation Age of Onset   . Osteoporosis Mother    . Hyperlipidemia Mother    . Hypertension Mother    . Heart disease Father    . Skin cancer Father    . Heart disease Brother    . Skin cancer Maternal Aunt    . Heart disease Maternal Grandfather    . Heart disease Paternal Grandfather          REVIEW OF SYSTEMS:    Review of Systems   Constitutional: Negative.    Respiratory: Negative.    Cardiovascular: Negative.    Musculoskeletal: Positive for gait problem.        Left foot pain   Skin: Negative.           PHYSICAL EXAM:    Vitals:    05/24/16 1735   BP: 117/74   Pulse: 68   Resp: 18   Temp: 36.6 ?C (97.9 ?F)   SpO2: 98%    Body mass index is 32.71 kg/m?Marland Kitchen  Physical Exam   Constitutional: He is oriented to person, place, and time. He appears well-developed and well-nourished.   HENT:   Head: Normocephalic and atraumatic.   Eyes: Pupils are  equal, round, and reactive to light.   Neck: Normal range of motion.   Musculoskeletal:   Left Ankle: No deformities, swelling, ecchymosis. No pain with plantarflexion, dorsiflexion, inversion and eversion.    Left Foot: No deformities, swelling, ecchymosis. Mild obvious swelling over the midfoot area with mild amount of tenderness over metatarsals. No pain with inversion and eversion of heel. Flexion of toes intact.          Neurological: He is alert and oriented to person, place, and time.   Skin: Skin is warm and dry.   Psychiatric: He has a normal mood and affect.   Nursing note and vitals reviewed.        ASSESSMENT & PLAN:  Patient has already had an extensive workup for this ongoing left foot pain. We do have a discussion about the possibility that it is gout. Patient will be referred to podiatry for further evaluation as I am concerned about the swelling in his midfoot area. In the meantime he will be started on a trial of colchicine to see if that will help his pain. I've told him to return if he is not significantly improved, otherwise he will follow-up with podiatry. He verbalizes understanding and is discharged home.    No results found for this or any previous visit (from the past 6 hour(s)).  No images are attached to the encounter.    1. Current smoker    2. Left foot pain  - colchicine 0.6 mg tablet; Take 1 tablet now and then repeat in one hour. Then take 1 tablet daily for 9 days.  Dispense: 11 tablet; Refill: 0  - Referral to Podiatry (External)        This note was transcribed using speech recognition software. Please contact us for clarification if any questions arise relating to the wording of this document.

## 2016-05-25 MED ORDER — colchicine 0.6 mg tablet
0.6 | ORAL_TABLET | ORAL | 0 refills | 15.50000 days | Status: DC
Start: 2016-05-25 — End: 2016-07-24

## 2016-05-26 NOTE — Telephone Encounter (Signed)
Urgent Care Follow Up Call    Urgent Care Follow Up 05/26/2016 05/12/2016   [facility] HOW IS THE PATIENT FEELING PT not available left a message. Swelling decreasing       No flowsheet data found.    No flowsheet data found.

## 2016-07-24 ENCOUNTER — Ambulatory Visit: Admit: 2016-07-24 | Discharge: 2016-07-24 | Payer: BC Managed Care – PPO | Attending: Family

## 2016-07-24 DIAGNOSIS — H65192 Other acute nonsuppurative otitis media, left ear: Secondary | ICD-10-CM

## 2016-07-24 MED ORDER — amoxicillin-clavulanate (AUGMENTIN) 875-125 mg per tablet
875-125 | ORAL_TABLET | Freq: Two times a day (BID) | ORAL | 0 refills | 7.00000 days | Status: AC
Start: 2016-07-24 — End: 2016-07-31

## 2016-07-24 NOTE — Progress Notes (Signed)
Wesley Booker is a 56 y.o. male, DOB 08-10-1960, who presents today for Cough (9 days, chest is sore ); Nasal Congestion; and Fatigue (10 days)  .    HISTORY OF PRESENT ILLNESS:    Patient presents for evaluation of cough and congestion for 7 days. He states that he has had fatigue and an exhausting cough.      Past Medical History:   Diagnosis Date   . Allergic rhinitis    . Arthritis     bilateral knee arthritis and ankle arthritis   . Dyslipidemia     on crestor     Past Surgical History:   Procedure Laterality Date   . MENISCECTOMY Bilateral    . menisectomy Bilateral    . VASECTOMY       Family History   Problem Relation Age of Onset   . Osteoporosis Mother    . Hyperlipidemia Mother    . Hypertension Mother    . Heart disease Father    . Skin cancer Father    . Heart disease Brother    . Skin cancer Maternal Aunt    . Heart disease Maternal Grandfather    . Heart disease Paternal Grandfather      Social History     Social History   . Marital status: Married     Spouse name: N/A   . Number of children: N/A   . Years of education: N/A     Occupational History   . Not on file.     Social History Main Topics   . Smoking status: Current Some Day Smoker     Types: Cigars   . Smokeless tobacco: Never Used      Comment: 1 Cigar a month, not exposed    . Alcohol use No   . Drug use: No   . Sexual activity: Yes     Partners: Female     Other Topics Concern   . Not on file     Social History Narrative   . No narrative on file           REVIEW OF SYSTEMS:    Review of Systems   Constitutional: Positive for activity change, appetite change, chills, diaphoresis and fatigue.   HENT: Positive for congestion, rhinorrhea, sinus pain and sinus pressure. Negative for voice change.    Musculoskeletal: Positive for arthralgias and myalgias.   Skin: Positive for color change.   Neurological: Positive for dizziness and headaches.   Hematological: Negative for adenopathy. Does not bruise/bleed easily.   Psychiatric/Behavioral:  Negative for agitation, behavioral problems and confusion.          PHYSICAL EXAM:    Vitals:    07/24/16 1616   BP: 112/70   Pulse: 80   Resp: 14   Temp: 36.8 ?C (98.2 ?F)   SpO2: 98%    Body mass index is 32.57 kg/m?Marland Kitchen  Physical Exam   Constitutional: He is oriented to person, place, and time. He appears well-developed and well-nourished.   HENT:   Head: Normocephalic and atraumatic.   Mouth/Throat: Oropharyngeal exudate present.   Eyes: Conjunctivae and EOM are normal. Pupils are equal, round, and reactive to light. Right eye exhibits no discharge. Left eye exhibits no discharge. No scleral icterus.   Neck: Normal range of motion. Neck supple. No tracheal deviation present. No thyromegaly present.   Cardiovascular: Normal rate.    Pulmonary/Chest: Effort normal. No stridor. No respiratory distress. He has decreased breath sounds. He  has wheezes in the right middle field, the right lower field and the left lower field. He has no rales.   Musculoskeletal: Normal range of motion.   Neurological: He is alert and oriented to person, place, and time. No cranial nerve deficit. Coordination normal.   Skin: Skin is warm and dry. No rash noted. He is not diaphoretic.   Psychiatric: He has a normal mood and affect. His behavior is normal. Judgment and thought content normal.   Nursing note and vitals reviewed.        ASSESSMENT & PLAN:  Patient presents for evaluation of coughing congestion ?7 days. He states he has had headaches and fatigue during this time as well as low-grade fevers of 99.1-101. I have evaluated a chest x-ray for this patient I do not see any signs of pneumonia he does have expiratory wheezing in his right mid lobe and his anterior lobes on auscultation. These do clear with a good cough. The patient states he's been using his nebulizer regularly as directed. I will treat his otitis media with Augmentin he has supportive medications such as suit her chest and a nebulizer at home. I have encouraged the  patient to rest and aggressively hydrate. He agrees with this plan and he is discharged home.    No results found for this or any previous visit (from the past 6 hour(s)).  No results found.      ICD-10-CM    1. Other acute nonsuppurative otitis media of left ear, recurrence not specified H65.192    2. Wheezing on expiration R06.2 X-ray chest PA and lateral     X-ray chest PA and lateral   3. Cough R05    4. Current every day smoker F17.200          This note was transcribed using speech recognition software. Please contact us for clarification if any questions arise relating to the wording of this document.

## 2016-07-24 NOTE — Discharge Instructions (Signed)
Patient Education     Otitis Media, Adult  Otitis media is redness, soreness, and puffiness (swelling) in the space just behind your eardrum (middle ear). It may be caused by allergies or infection. It often happens along with a cold.  Follow these instructions at home:  · Take your medicine as told. Finish it even if you start to feel better.  · Only take over-the-counter or prescription medicines for pain, discomfort, or fever as told by your doctor.  · Follow up with your doctor as told.  Contact a doctor if:  · You have otitis media only in one ear, or bleeding from your nose, or both.  · You notice a lump on your neck.  · You are not getting better in 3-5 days.  · You feel worse instead of better.  Get help right away if:  · You have pain that is not helped with medicine.  · You have puffiness, redness, or pain around your ear.  · You get a stiff neck.  · You cannot move part of your face (paralysis).  · You notice that the bone behind your ear hurts when you touch it.  This information is not intended to replace advice given to you by your health care provider. Make sure you discuss any questions you have with your health care provider.  Document Released: 06/16/2007 Document Revised: 06/05/2015 Document Reviewed: 07/25/2012  Elsevier Interactive Patient Education © 2017 [employer]

## 2016-07-26 NOTE — Telephone Encounter (Signed)
Urgent Care Follow Up Call    Urgent Care Follow Up 07/26/2016 05/26/2016 05/12/2016   [facility] HOW IS THE PATIENT FEELING Pt feeling much better PT not available left a message. Swelling decreasing   [facility] FAMILY HAVE QUESTIONS No - -       Medications 07/26/2016   [facility] PT UNDERSTAND MEDS 1   [facility] PT FILLED MEDICATIONS Yes, all medications prescribed at discharge were filled       Follow Up 07/26/2016   [facility] QUESTIONS CONCERNS Denies any questions or concerns at this time

## 2016-08-09 ENCOUNTER — Encounter: Payer: BC Managed Care – PPO | Attending: MD

## 2017-02-02 ENCOUNTER — Encounter: Payer: BC Managed Care – PPO | Attending: MD

## 2021-04-14 ENCOUNTER — Inpatient Hospital Stay: Admit: 2021-04-14 | Payer: BLUE CROSS/BLUE SHIELD

## 2021-04-14 DIAGNOSIS — M25552 Pain in left hip: Secondary | ICD-10-CM

## 2022-07-19 IMAGING — DX HIP BILATERAL WITH PELVIS 5 VIEWS
1 series · 5 of 5 positions shown · non-contrast
Comparison: None.

________________________________________________________________________________________________ 
HIP BILATERAL WITH PELVIS 5 VIEWS, 07/19/2022 [DATE]: 
CLINICAL INDICATION: Unspecified Osteoarthritis, Unspecified Site.

[Series 1: AP · U · 0.14mm/px · 5 of 5 slices shown]
[im 1/5]
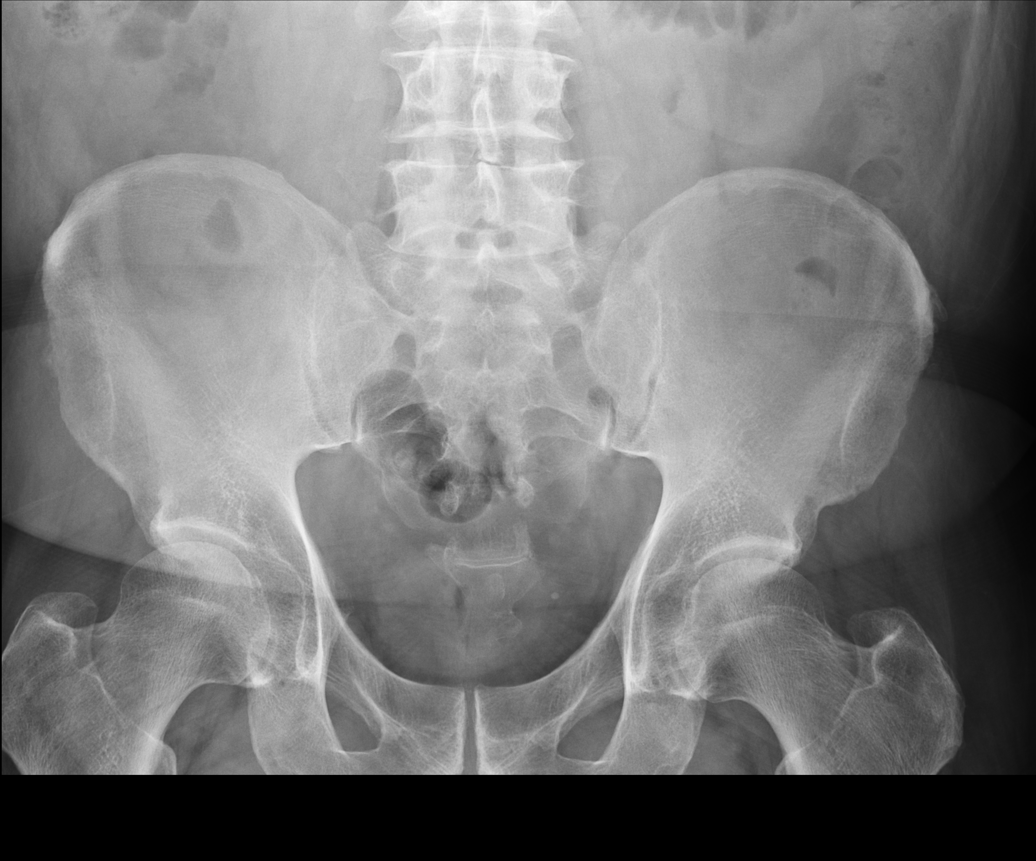
[im 2/5]
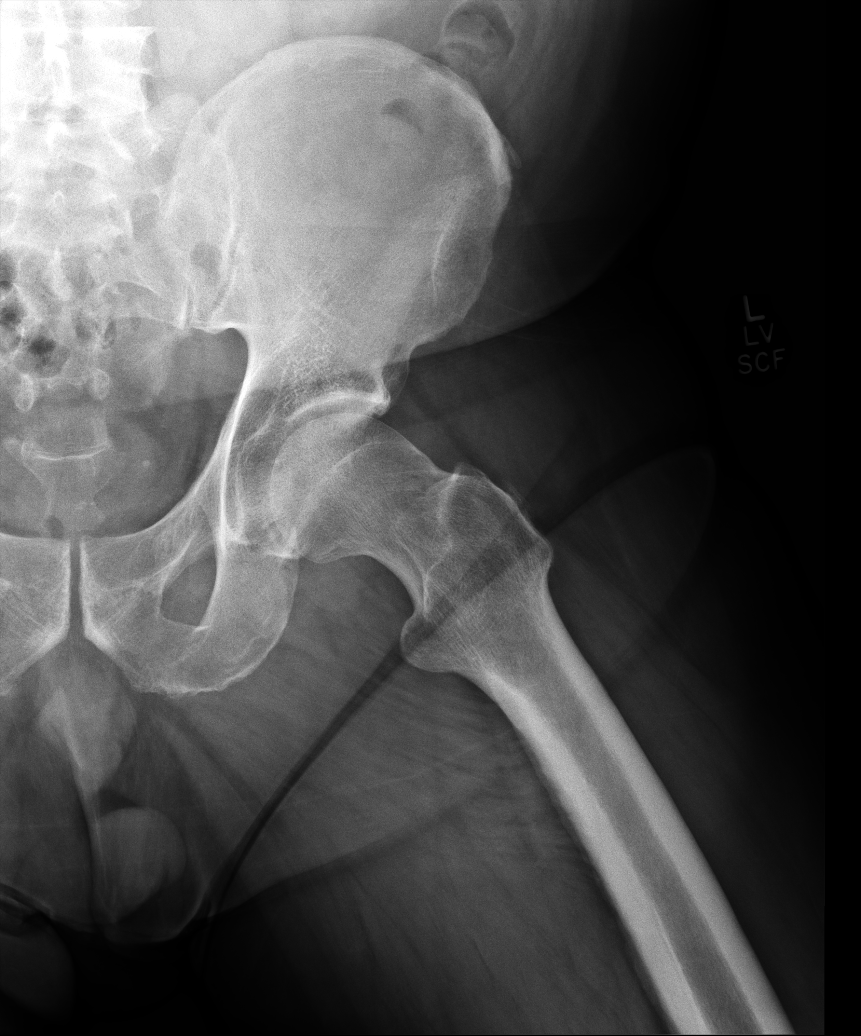
[im 3/5]
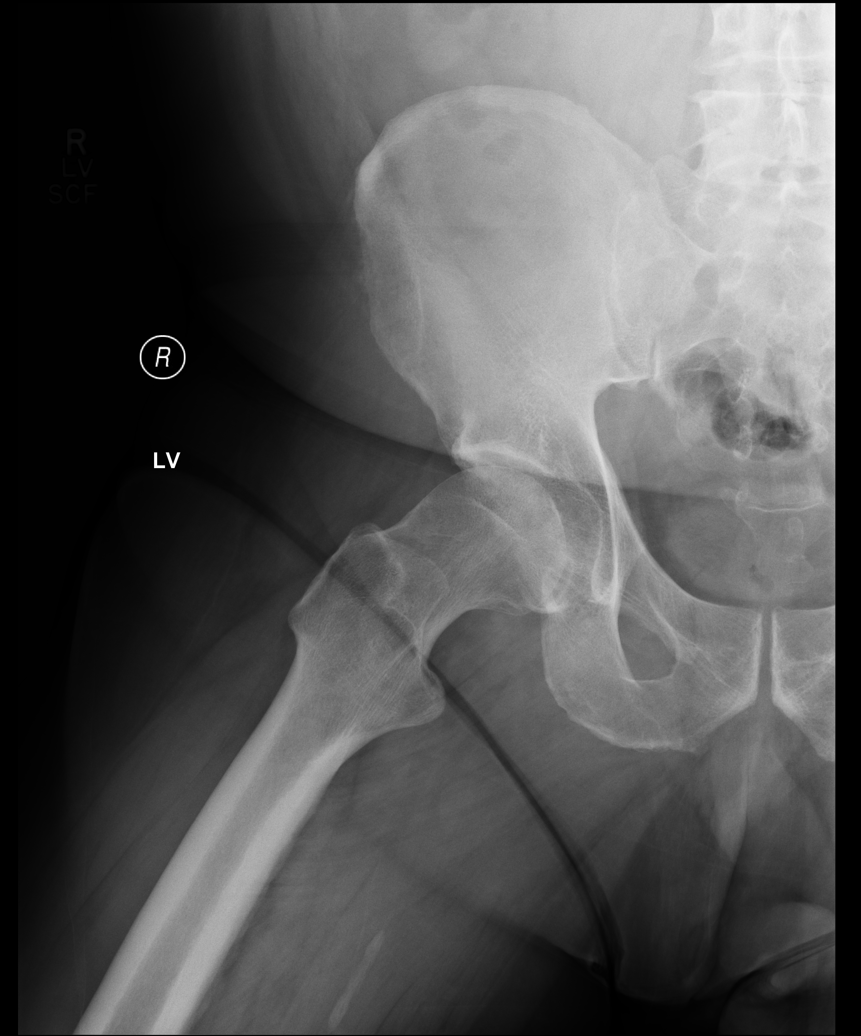
[im 4/5]
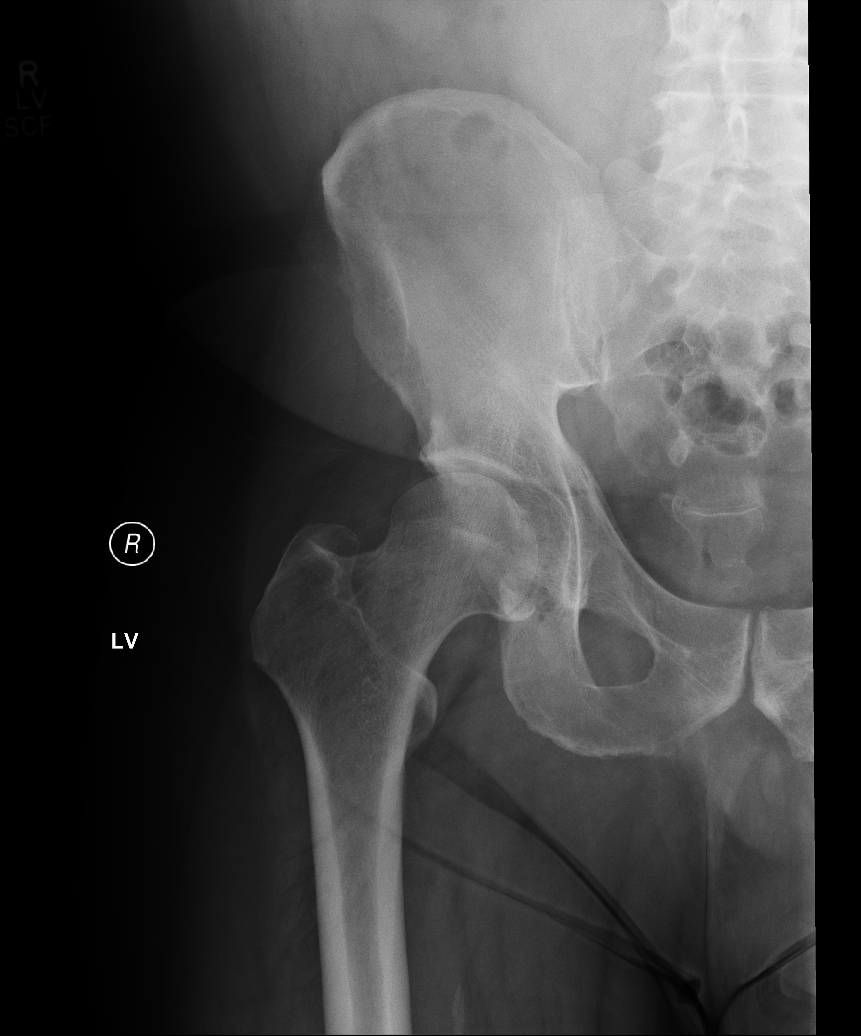
[im 5/5]
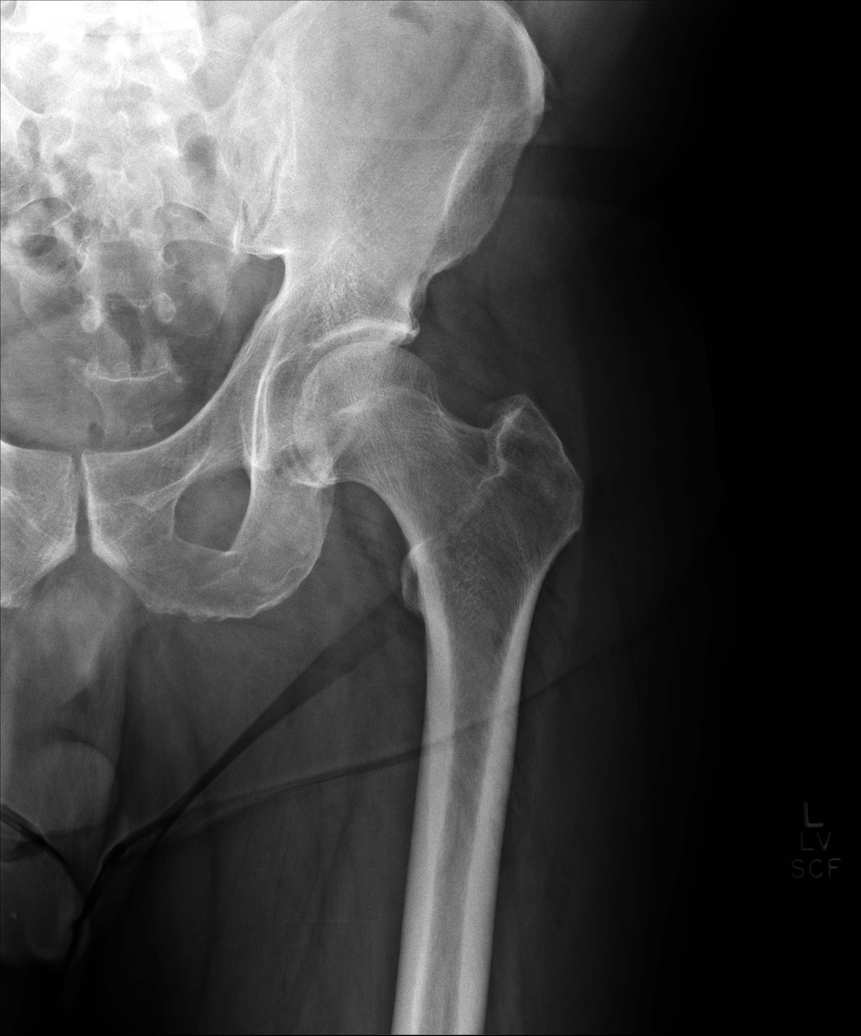

[5 of 5 positions shown; findings below may reference images not displayed]

FINDINGS: No fracture. Normal alignment. Hip joint spaces are preserved. Mild 
degenerative change of the spine. Small left pelvic phlebolith.
IMPRESSION: 1.  Hip joint spaces are preserved.  
2.  Mild degenerative change of the spine.

## 2022-11-03 IMAGING — DX KNEE 4 VIEWS RIGHT
1 series · 4 of 4 positions shown · non-contrast
Comparison: None.

________________________________________________________________________________________________ 
KNEE 4 VIEWS RIGHT, 11/03/2022 [DATE]: 
CLINICAL INDICATION: Pain In right knee.

[Series 1: AP · 0.14mm/px · 4 of 4 slices shown]
[im 1/4]
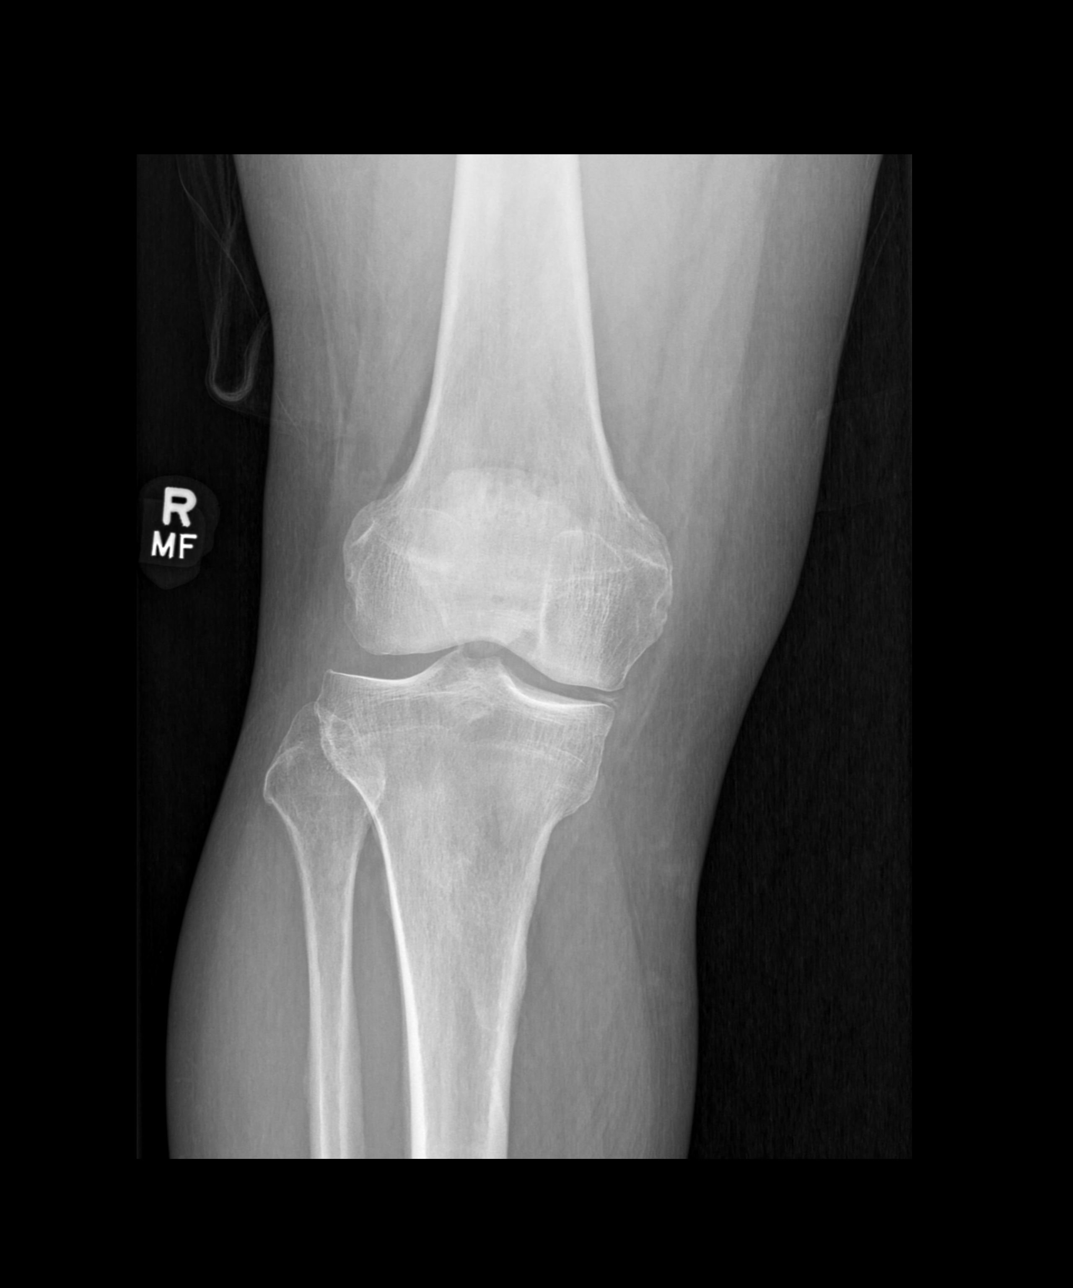
[im 2/4]
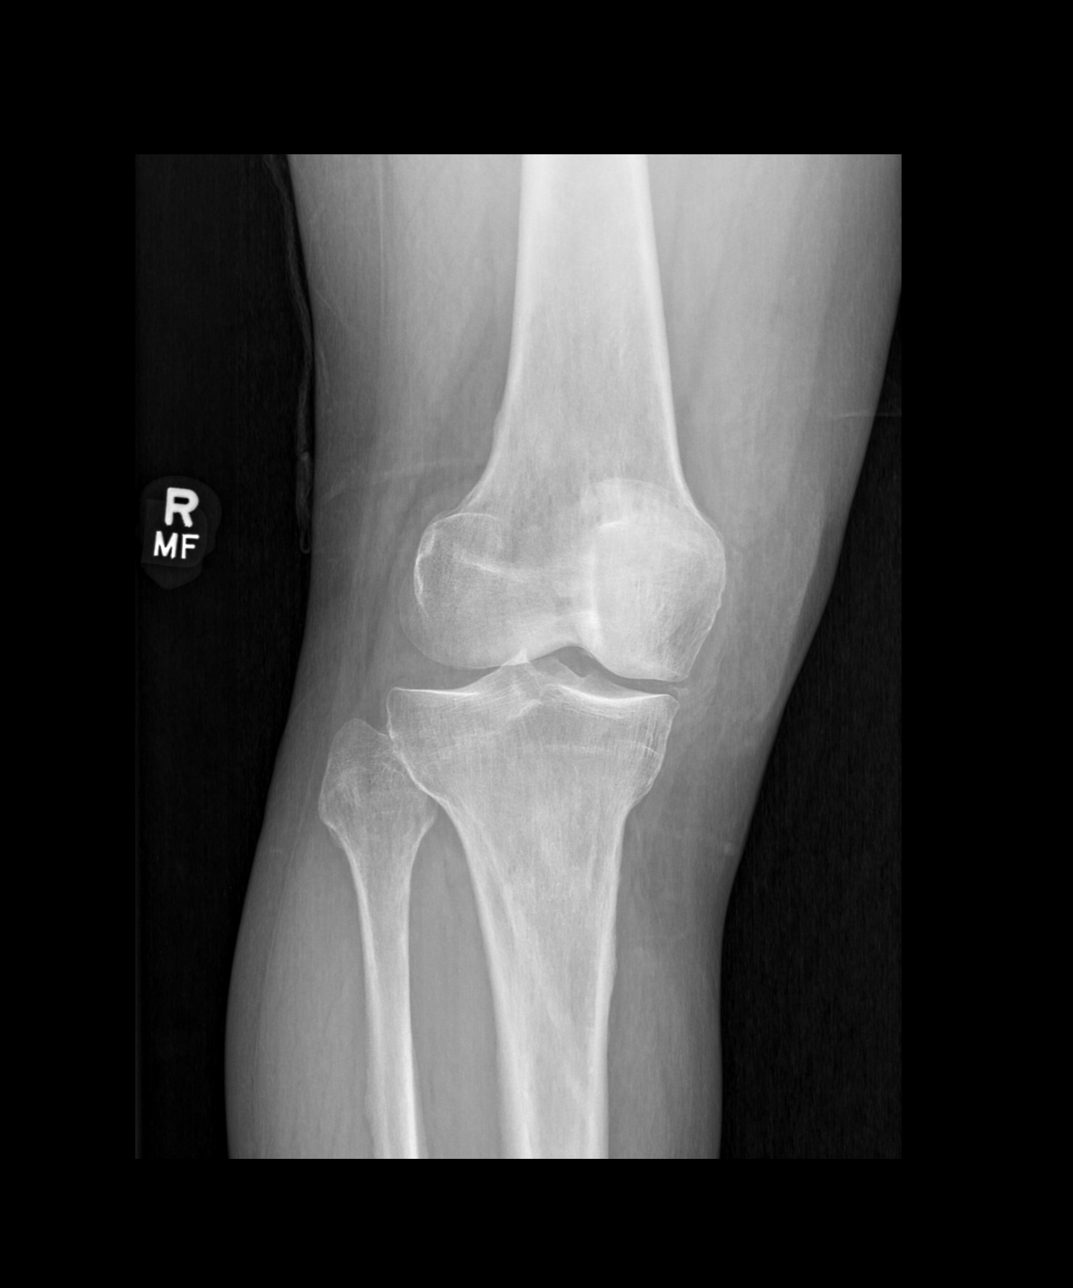
[im 3/4]
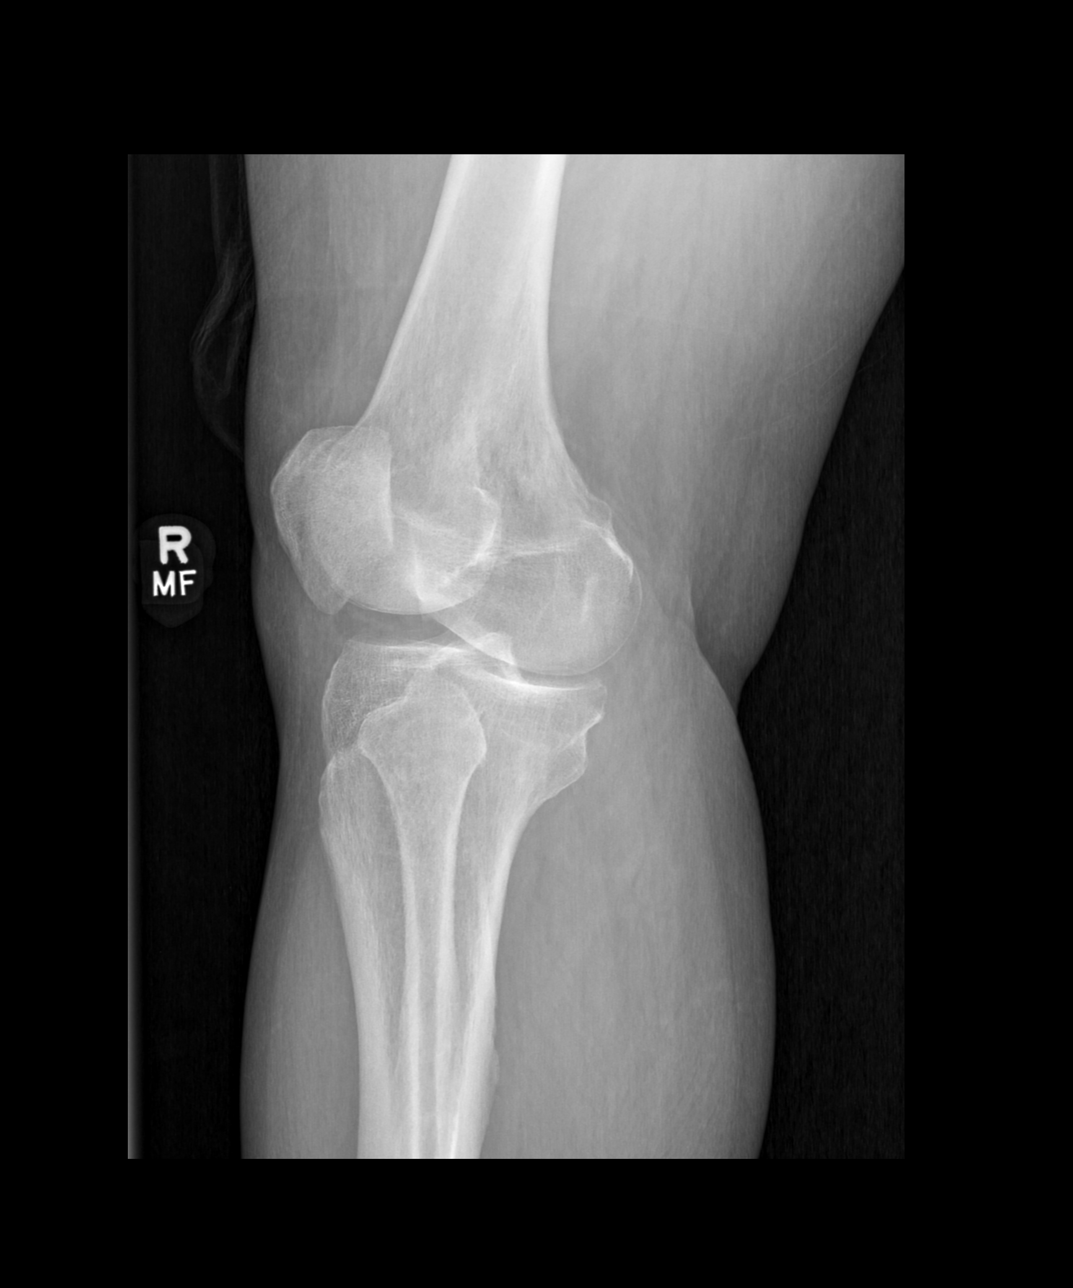
[im 4/4]
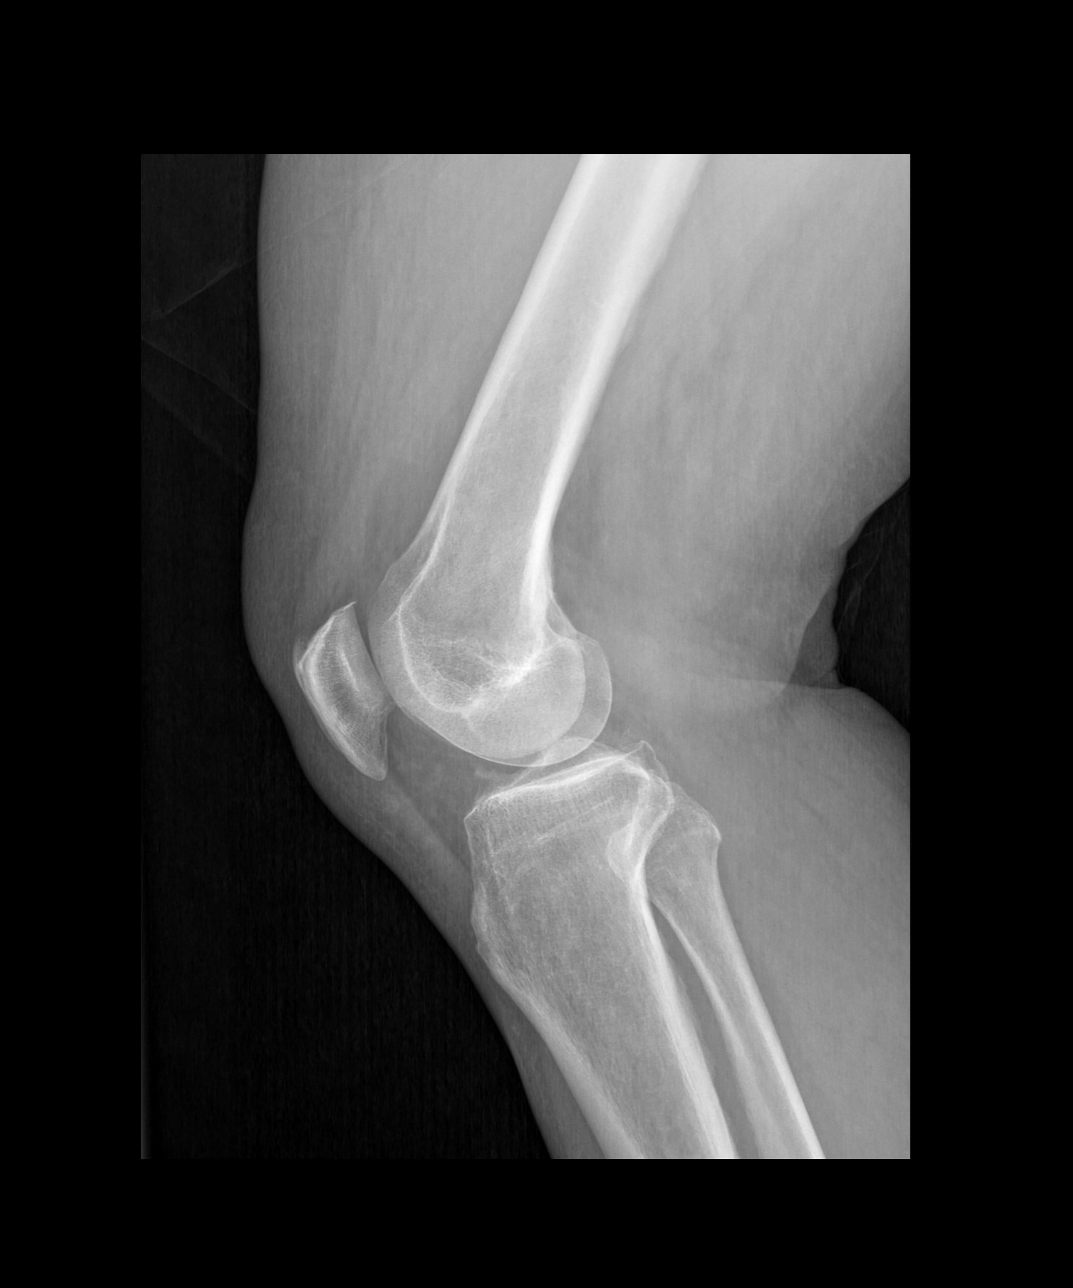

[4 of 4 positions shown; findings below may reference images not displayed]

FINDINGS: Mild medial and patellofemoral compartment joint space narrowing. 
Mild chondrocalcinosis. No fracture. Normal alignment. No knee joint effusion. 
No focal soft tissue swelling.
IMPRESSION: Mild degenerative changes. Consideration could be made for MR exam.

## 2022-12-03 IMAGING — MR MRI RIGHT KNEE WITHOUT CONTRAST
4 of 6 series · 20 of 40 positions shown · IV contrast (gadolinium)
Comparison: 11/03/2022 radiographs

________________________________________________________________________________________________ 
MRI RIGHT KNEE WITHOUT CONTRAST, 12/03/2022 [DATE]: 
CLINICAL INDICATION: Right knee pain. Patient reports prior knee surgery (report 
not available at this time).
TECHNIQUE: Multiplanar, multiecho position MR images of the right knee were 
performed without intravenous gadolinium enhancement.

[Series 101: survey_fullfov_transversal · axial · 10.0mm · 1.84mm/px · z∈[-82,+82]mm · 3 of 12 slices shown]
[im 1/12]
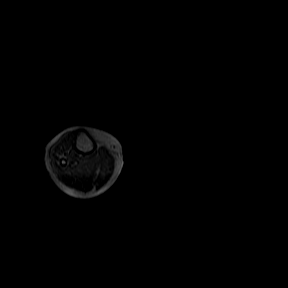
[im 6/12]
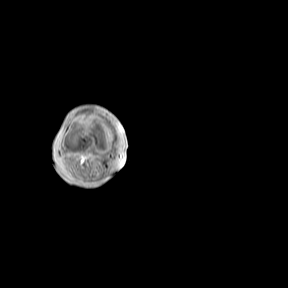
[im 12/12]
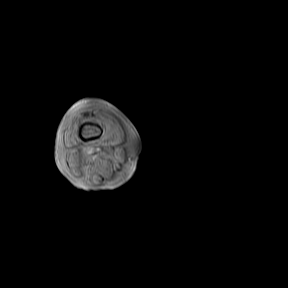

[Series 201: survey_right · axial · 10.0mm · 0.94mm/px · z∈[-40,+150]mm · 3 of 15 slices shown]
[im 1/15]
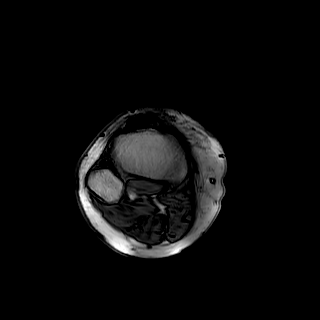
[im 8/15]
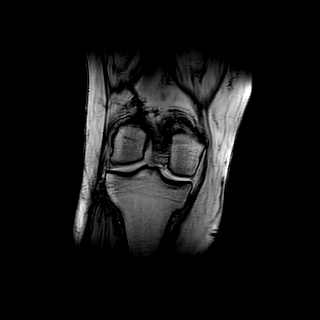
[im 15/15]
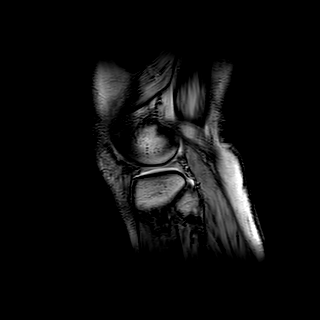

[Series 301: pdw spair ax · axial · 3.0mm · 0.29mm/px · z∈[-65,+43]mm · 5 of 36 slices shown]
[im 1/36]
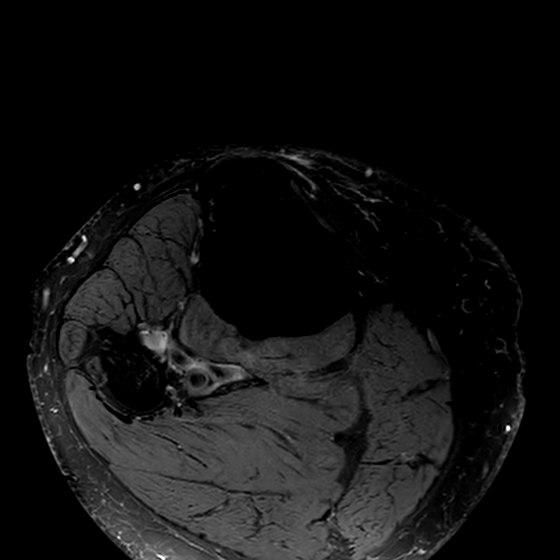
[im 6/36]
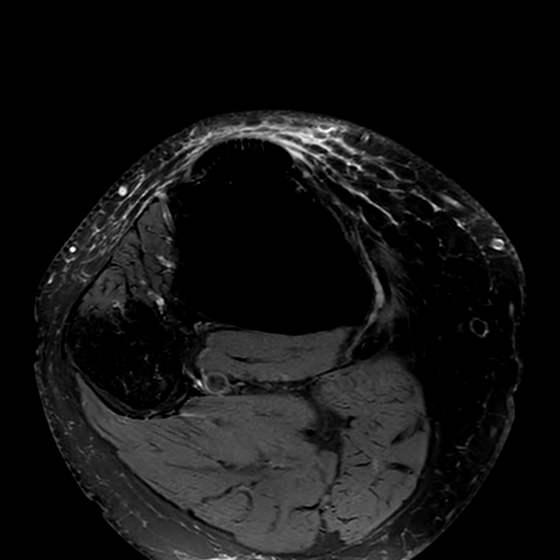
[im 11/36]
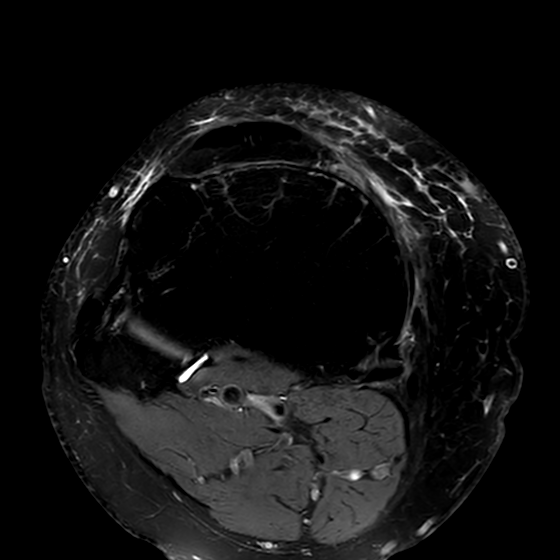
[im 21/36]
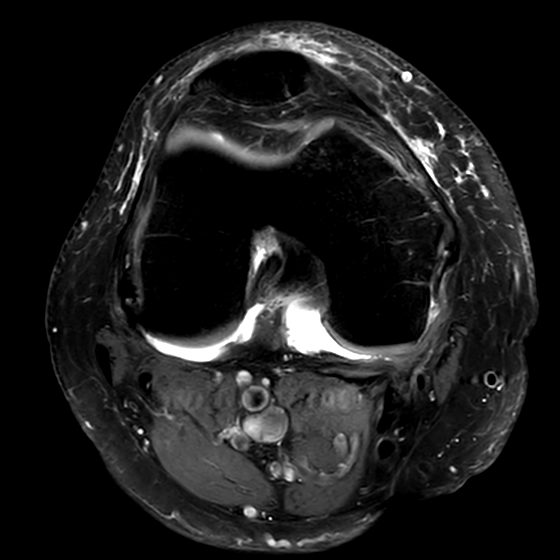
[im 31/36]
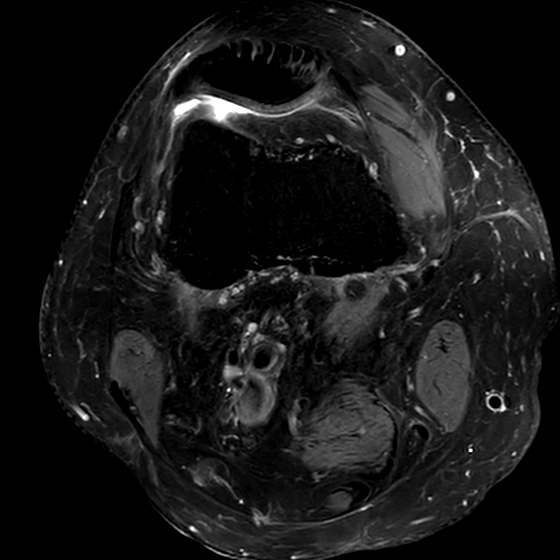

[Series 601: T1 · coronal · 3.0mm · 0.29mm/px · 9 of 42 slices shown]
[im 1/42]
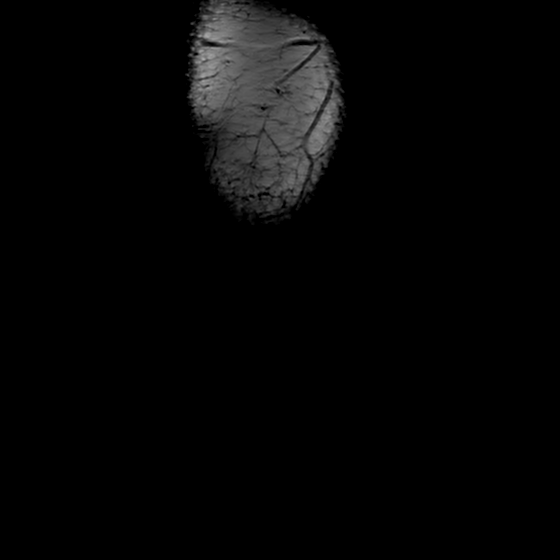
[im 6/42]
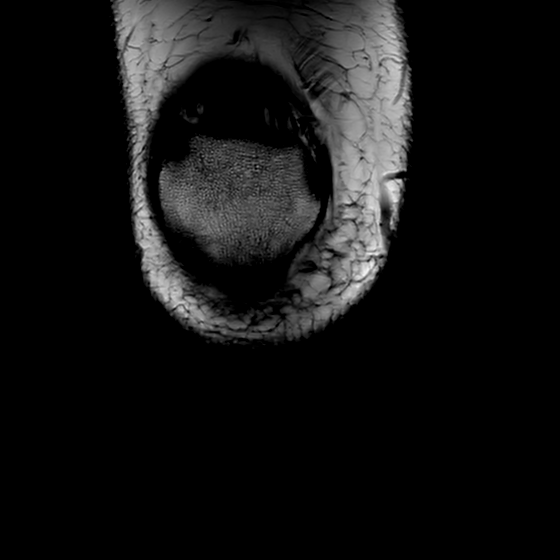
[im 11/42]
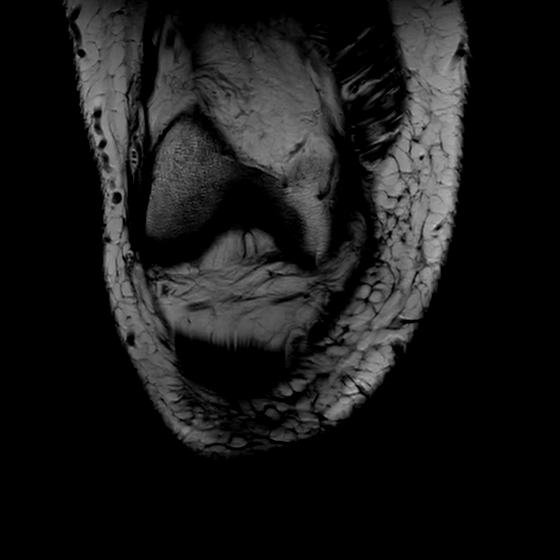
[im 16/42]
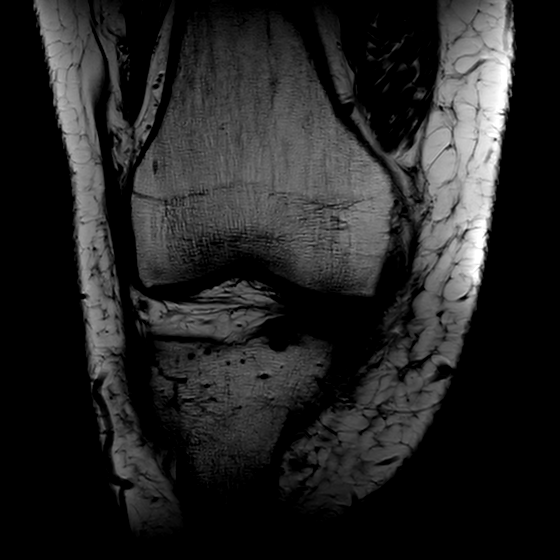
[im 21/42]
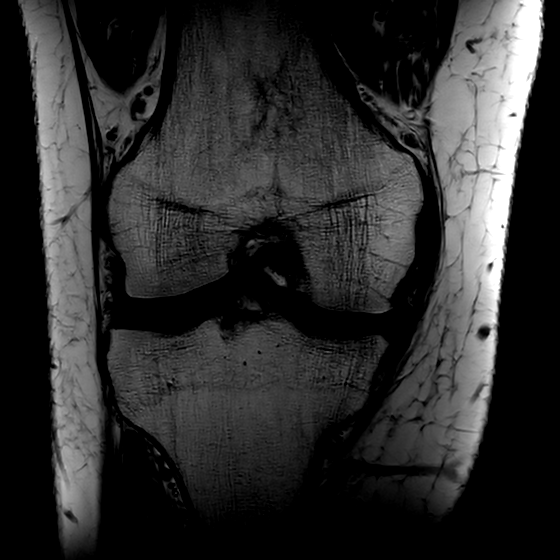
[im 26/42]
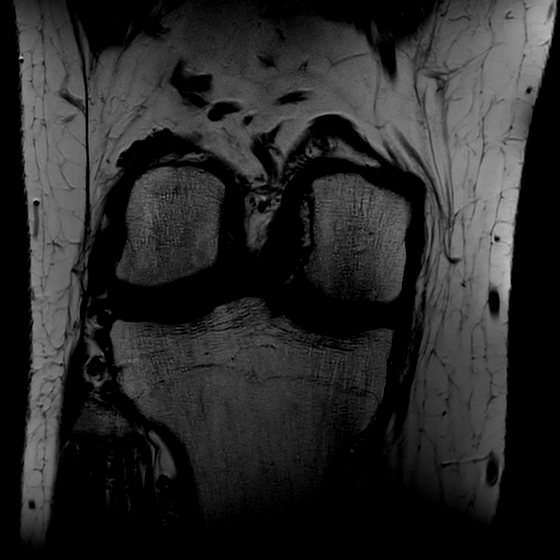
[im 31/42]
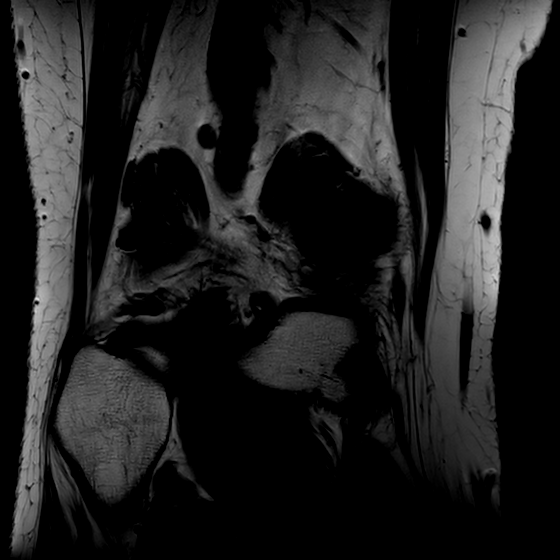
[im 36/42]
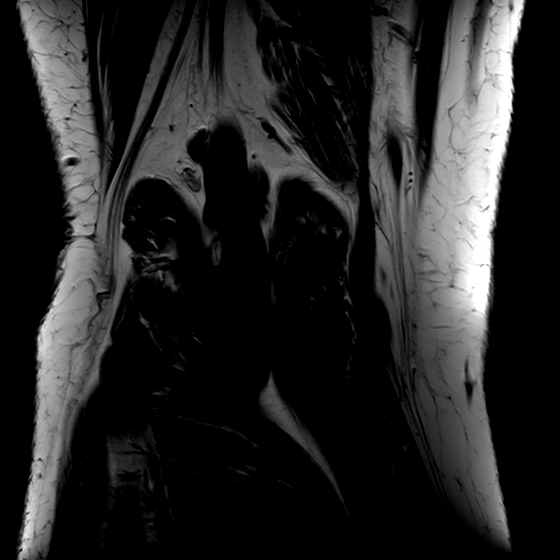
[im 42/42]
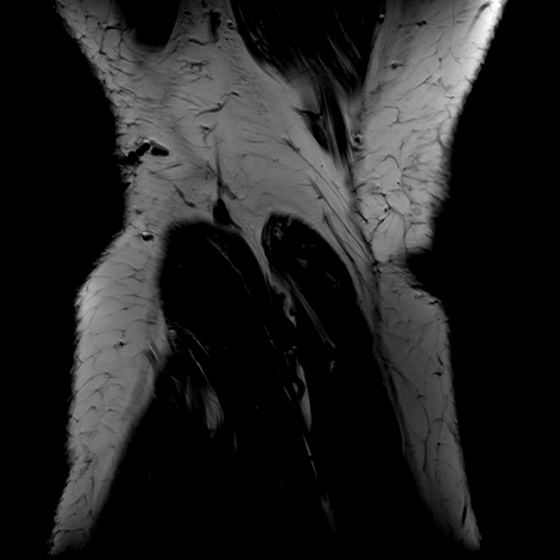

[20 of 40 positions shown; findings below may reference images not displayed]

FINDINGS: MEDIAL COMPARTMENT: Complex tear of the medial meniscal body with 0.7 cm 
superiorly displaced fragment. Horizontal tear of the posterior horn. Articular 
cartilage is preserved. Tiny osteophytes. 
LATERAL COMPARTMENT: The lateral meniscus is intact without tear or extrusion. 
Articular cartilage is preserved. 0.4 cm anterior tibial subcortical cyst. Tiny 
osteophytes. 
PATELLOFEMORAL COMPARTMENT: The patella is centrally located. Up to grade IV 
chondromalacia and subcortical cystic change of the medial trochlea and grade II 
chondromalacia patella. 
PROXIMAL TIBIOFIBULAR JOINT: Preserved. 
LIGAMENTS: The anterior cruciate ligament is intact. The posterior cruciate 
ligament is intact. The medial collateral ligament and lateral collateral 
ligament are preserved.  
EXTENSOR MECHANISM: The quadriceps and patellar tendon are preserved. The medial 
and lateral retinacula are intact. 
POSTEROMEDIAL CORNER: The semimembranosus and pes anserine tendons are 
preserved. The posterior oblique ligament and posterior medial joint capsule are 
intact. 
POSTEROLATERAL CORNER: The popliteal tendon and popliteofibular ligament are 
intact. The biceps femoris is negative. 
BONES AND SOFT TISSUES: No fracture or contusion. Superior patellar 
enthesophyte. No joint effusion. No popliteal cyst. The musculature is symmetric 
without mass, signal abnormality or atrophy. Small superficial varicosities. 
Mild subcutaneous soft tissue swelling.
IMPRESSION: 1.  Complex tear of the medial meniscal body with 0.7 cm superiorly displaced 
fragment, and horizontal tear of the posterior horn.  
2.  Moderate medial trochlear degenerative change and mild degenerative change 
of the remaining knee. 
3.  Superior patellar enthesophyte.  
4.  Small superficial varicosities and mild subcutaneous soft tissue swelling.
